# Patient Record
Sex: Female | Born: 1935 | Race: White | Hispanic: No | Smoking: Former smoker
Health system: Southern US, Community
[De-identification: ages and names within clinical notes are randomized; demographics above are authoritative.]

## PROBLEM LIST (undated history)

## (undated) DIAGNOSIS — G459 Transient cerebral ischemic attack, unspecified: Secondary | ICD-10-CM

## (undated) DIAGNOSIS — R011 Cardiac murmur, unspecified: Secondary | ICD-10-CM

## (undated) DIAGNOSIS — E079 Disorder of thyroid, unspecified: Secondary | ICD-10-CM

## (undated) DIAGNOSIS — N200 Calculus of kidney: Secondary | ICD-10-CM

## (undated) DIAGNOSIS — F419 Anxiety disorder, unspecified: Secondary | ICD-10-CM

## (undated) DIAGNOSIS — G20A1 Parkinson's disease without dyskinesia, without mention of fluctuations: Secondary | ICD-10-CM

## (undated) DIAGNOSIS — E785 Hyperlipidemia, unspecified: Secondary | ICD-10-CM

## (undated) DIAGNOSIS — E119 Type 2 diabetes mellitus without complications: Secondary | ICD-10-CM

## (undated) DIAGNOSIS — F0390 Unspecified dementia without behavioral disturbance: Secondary | ICD-10-CM

## (undated) DIAGNOSIS — I10 Essential (primary) hypertension: Secondary | ICD-10-CM

## (undated) DIAGNOSIS — N289 Disorder of kidney and ureter, unspecified: Secondary | ICD-10-CM

## (undated) DIAGNOSIS — M1990 Unspecified osteoarthritis, unspecified site: Secondary | ICD-10-CM

## (undated) DIAGNOSIS — K219 Gastro-esophageal reflux disease without esophagitis: Secondary | ICD-10-CM

## (undated) DIAGNOSIS — F329 Major depressive disorder, single episode, unspecified: Secondary | ICD-10-CM

## (undated) DIAGNOSIS — I639 Cerebral infarction, unspecified: Secondary | ICD-10-CM

## (undated) DIAGNOSIS — G20 Parkinson's disease: Secondary | ICD-10-CM

## (undated) DIAGNOSIS — F32A Depression, unspecified: Secondary | ICD-10-CM

## (undated) HISTORY — DX: Unspecified osteoarthritis, unspecified site: M19.90

## (undated) HISTORY — DX: Transient cerebral ischemic attack, unspecified: G45.9

## (undated) HISTORY — DX: Calculus of kidney: N20.0

## (undated) HISTORY — PX: CHOLECYSTECTOMY: SHX55

## (undated) HISTORY — PX: CAROTID ENDARTERECTOMY: SUR193

## (undated) HISTORY — PX: TONSILLECTOMY AND ADENOIDECTOMY: SUR1326

## (undated) HISTORY — DX: Cardiac murmur, unspecified: R01.1

---

## 2004-09-27 ENCOUNTER — Inpatient Hospital Stay: Payer: Self-pay | Admitting: Internal Medicine

## 2005-04-26 ENCOUNTER — Ambulatory Visit: Payer: Self-pay | Admitting: Internal Medicine

## 2005-07-04 ENCOUNTER — Ambulatory Visit: Payer: Self-pay | Admitting: Pain Medicine

## 2005-07-12 ENCOUNTER — Ambulatory Visit: Payer: Self-pay | Admitting: Pain Medicine

## 2005-07-16 ENCOUNTER — Ambulatory Visit: Payer: Self-pay | Admitting: Pain Medicine

## 2005-07-24 ENCOUNTER — Other Ambulatory Visit: Payer: Self-pay

## 2005-07-24 ENCOUNTER — Inpatient Hospital Stay: Payer: Self-pay | Admitting: Internal Medicine

## 2005-12-10 ENCOUNTER — Ambulatory Visit: Payer: Self-pay | Admitting: *Deleted

## 2006-02-20 ENCOUNTER — Ambulatory Visit (HOSPITAL_COMMUNITY): Admission: RE | Admit: 2006-02-20 | Discharge: 2006-02-21 | Payer: Self-pay | Admitting: *Deleted

## 2006-05-06 ENCOUNTER — Inpatient Hospital Stay: Payer: Self-pay | Admitting: Internal Medicine

## 2006-08-06 ENCOUNTER — Ambulatory Visit: Payer: Self-pay | Admitting: Internal Medicine

## 2006-10-09 ENCOUNTER — Ambulatory Visit: Payer: Self-pay | Admitting: Unknown Physician Specialty

## 2006-12-18 ENCOUNTER — Encounter: Admission: RE | Admit: 2006-12-18 | Discharge: 2006-12-18 | Payer: Self-pay | Admitting: Internal Medicine

## 2008-01-01 ENCOUNTER — Ambulatory Visit: Payer: Self-pay | Admitting: Internal Medicine

## 2008-01-09 ENCOUNTER — Other Ambulatory Visit: Payer: Self-pay

## 2008-01-09 ENCOUNTER — Emergency Department: Payer: Self-pay | Admitting: Emergency Medicine

## 2008-07-11 ENCOUNTER — Ambulatory Visit: Payer: Self-pay | Admitting: Internal Medicine

## 2008-08-02 ENCOUNTER — Ambulatory Visit: Payer: Self-pay | Admitting: Internal Medicine

## 2008-08-10 ENCOUNTER — Ambulatory Visit: Payer: Self-pay | Admitting: Internal Medicine

## 2008-10-11 ENCOUNTER — Ambulatory Visit: Payer: Self-pay | Admitting: Internal Medicine

## 2008-11-03 ENCOUNTER — Ambulatory Visit: Payer: Self-pay | Admitting: Internal Medicine

## 2008-11-08 ENCOUNTER — Ambulatory Visit: Payer: Self-pay | Admitting: Internal Medicine

## 2009-01-08 ENCOUNTER — Ambulatory Visit: Payer: Self-pay | Admitting: Internal Medicine

## 2009-02-02 ENCOUNTER — Ambulatory Visit: Payer: Self-pay | Admitting: Internal Medicine

## 2009-02-08 ENCOUNTER — Ambulatory Visit: Payer: Self-pay | Admitting: Internal Medicine

## 2009-05-19 ENCOUNTER — Ambulatory Visit: Payer: Self-pay | Admitting: Internal Medicine

## 2009-06-10 ENCOUNTER — Ambulatory Visit: Payer: Self-pay | Admitting: Internal Medicine

## 2009-06-17 ENCOUNTER — Ambulatory Visit: Payer: Self-pay | Admitting: Internal Medicine

## 2009-08-18 ENCOUNTER — Ambulatory Visit: Payer: Self-pay | Admitting: Internal Medicine

## 2009-09-10 ENCOUNTER — Ambulatory Visit: Payer: Self-pay | Admitting: Internal Medicine

## 2009-11-16 ENCOUNTER — Ambulatory Visit: Payer: Self-pay | Admitting: Internal Medicine

## 2009-12-09 ENCOUNTER — Ambulatory Visit: Payer: Self-pay | Admitting: Internal Medicine

## 2010-02-08 ENCOUNTER — Ambulatory Visit: Payer: Self-pay | Admitting: Internal Medicine

## 2010-02-15 ENCOUNTER — Ambulatory Visit: Payer: Self-pay | Admitting: Internal Medicine

## 2010-02-20 ENCOUNTER — Ambulatory Visit: Payer: Self-pay | Admitting: Unknown Physician Specialty

## 2010-03-10 ENCOUNTER — Ambulatory Visit: Payer: Self-pay | Admitting: Internal Medicine

## 2010-06-19 ENCOUNTER — Ambulatory Visit: Payer: Self-pay | Admitting: Internal Medicine

## 2010-07-11 ENCOUNTER — Ambulatory Visit: Payer: Self-pay | Admitting: Internal Medicine

## 2010-10-20 ENCOUNTER — Ambulatory Visit: Payer: Self-pay | Admitting: Internal Medicine

## 2010-11-09 ENCOUNTER — Ambulatory Visit: Payer: Self-pay | Admitting: Internal Medicine

## 2011-01-27 ENCOUNTER — Ambulatory Visit: Payer: Self-pay | Admitting: Internal Medicine

## 2011-02-07 ENCOUNTER — Other Ambulatory Visit: Payer: Self-pay | Admitting: Unknown Physician Specialty

## 2011-02-14 ENCOUNTER — Ambulatory Visit: Payer: Self-pay | Admitting: Internal Medicine

## 2011-03-19 ENCOUNTER — Ambulatory Visit: Payer: Self-pay | Admitting: Internal Medicine

## 2011-04-11 ENCOUNTER — Ambulatory Visit: Payer: Self-pay | Admitting: Internal Medicine

## 2012-01-01 ENCOUNTER — Emergency Department: Payer: Self-pay | Admitting: Emergency Medicine

## 2012-02-19 ENCOUNTER — Ambulatory Visit: Payer: Self-pay | Admitting: Internal Medicine

## 2012-07-08 LAB — CBC
HCT: 34.7 % — ABNORMAL LOW (ref 35.0–47.0)
MCH: 28.7 pg (ref 26.0–34.0)
MCHC: 33.1 g/dL (ref 32.0–36.0)
RBC: 4 10*6/uL (ref 3.80–5.20)
RDW: 13.4 % (ref 11.5–14.5)
WBC: 12.5 10*3/uL — ABNORMAL HIGH (ref 3.6–11.0)

## 2012-07-08 LAB — COMPREHENSIVE METABOLIC PANEL
Albumin: 3.5 g/dL (ref 3.4–5.0)
Anion Gap: 8 (ref 7–16)
Calcium, Total: 8.6 mg/dL (ref 8.5–10.1)
Chloride: 112 mmol/L — ABNORMAL HIGH (ref 98–107)
EGFR (African American): 50 — ABNORMAL LOW
EGFR (Non-African Amer.): 43 — ABNORMAL LOW
Glucose: 112 mg/dL — ABNORMAL HIGH (ref 65–99)
Potassium: 4.6 mmol/L (ref 3.5–5.1)
Sodium: 142 mmol/L (ref 136–145)

## 2012-07-08 LAB — TROPONIN I: Troponin-I: 0.05 ng/mL

## 2012-07-08 LAB — URINALYSIS, COMPLETE
Glucose,UR: NEGATIVE mg/dL (ref 0–75)
Nitrite: POSITIVE
Ph: 5 (ref 4.5–8.0)
Protein: 30

## 2012-07-09 ENCOUNTER — Inpatient Hospital Stay: Payer: Self-pay | Admitting: Internal Medicine

## 2012-07-09 LAB — COMPREHENSIVE METABOLIC PANEL
Bilirubin,Total: 0.3 mg/dL (ref 0.2–1.0)
Chloride: 115 mmol/L — ABNORMAL HIGH (ref 98–107)
Co2: 13 mmol/L — ABNORMAL LOW (ref 21–32)
EGFR (African American): >60
EGFR (Non-African Amer.): 52 — ABNORMAL LOW
Glucose: 76 mg/dL (ref 65–99)
Osmolality: 283 (ref 275–301)
Potassium: 4.6 mmol/L (ref 3.5–5.1)
SGPT (ALT): 16 U/L (ref 12–78)
Total Protein: 6 g/dL — ABNORMAL LOW (ref 6.4–8.2)

## 2012-07-09 LAB — CBC WITH DIFFERENTIAL/PLATELET
Basophil %: 0.4 %
Eosinophil #: 0.1 10*3/uL (ref 0.0–0.7)
Eosinophil %: 0.4 %
Lymphocyte %: 11.5 %
MCH: 29.5 pg (ref 26.0–34.0)
MCV: 88 fL (ref 80–100)
Neutrophil %: 78.3 %
Platelet: 244 10*3/uL (ref 150–440)
RBC: 3.6 10*6/uL — ABNORMAL LOW (ref 3.80–5.20)

## 2012-07-10 LAB — BASIC METABOLIC PANEL
Anion Gap: 10 (ref 7–16)
BUN: 15 mg/dL (ref 7–18)
Chloride: 110 mmol/L — ABNORMAL HIGH (ref 98–107)
Creatinine: 1.08 mg/dL (ref 0.60–1.30)
Potassium: 4.7 mmol/L (ref 3.5–5.1)
Sodium: 140 mmol/L (ref 136–145)

## 2012-07-10 LAB — CBC WITH DIFFERENTIAL/PLATELET
Basophil %: 0.3 %
Eosinophil %: 0.4 %
Lymphocyte #: 1.2 10*3/uL (ref 1.0–3.6)
MCH: 29.2 pg (ref 26.0–34.0)
MCV: 85 fL (ref 80–100)
Monocyte #: 1.2 x10 3/mm — ABNORMAL HIGH (ref 0.2–0.9)
Neutrophil #: 9 10*3/uL — ABNORMAL HIGH (ref 1.4–6.5)
RBC: 3.44 10*6/uL — ABNORMAL LOW (ref 3.80–5.20)

## 2012-07-11 LAB — CBC WITH DIFFERENTIAL/PLATELET
Basophil %: 0.2 %
Eosinophil #: 0.3 10*3/uL (ref 0.0–0.7)
Lymphocyte #: 1.6 10*3/uL (ref 1.0–3.6)
MCV: 85 fL (ref 80–100)
Monocyte #: 1.2 x10 3/mm — ABNORMAL HIGH (ref 0.2–0.9)
Neutrophil #: 7.2 10*3/uL — ABNORMAL HIGH (ref 1.4–6.5)
Neutrophil %: 70.4 %
Platelet: 210 10*3/uL (ref 150–440)
RBC: 3.28 10*6/uL — ABNORMAL LOW (ref 3.80–5.20)
RDW: 12.7 % (ref 11.5–14.5)
WBC: 10.2 10*3/uL (ref 3.6–11.0)

## 2012-07-11 LAB — COMPREHENSIVE METABOLIC PANEL
Albumin: 2.5 g/dL — ABNORMAL LOW (ref 3.4–5.0)
Anion Gap: 11 (ref 7–16)
Calcium, Total: 8 mg/dL — ABNORMAL LOW (ref 8.5–10.1)
Co2: 20 mmol/L — ABNORMAL LOW (ref 21–32)
EGFR (African American): 49 — ABNORMAL LOW
Potassium: 3.6 mmol/L (ref 3.5–5.1)
SGOT(AST): 39 U/L — ABNORMAL HIGH (ref 15–37)
Sodium: 141 mmol/L (ref 136–145)

## 2012-07-12 LAB — BASIC METABOLIC PANEL
Anion Gap: 10 (ref 7–16)
BUN: 13 mg/dL (ref 7–18)
Chloride: 113 mmol/L — ABNORMAL HIGH (ref 98–107)
Creatinine: 1.13 mg/dL (ref 0.60–1.30)
EGFR (Non-African Amer.): 45 — ABNORMAL LOW
Glucose: 117 mg/dL — ABNORMAL HIGH (ref 65–99)
Osmolality: 286 (ref 275–301)
Potassium: 4.1 mmol/L (ref 3.5–5.1)

## 2012-07-12 LAB — CBC WITH DIFFERENTIAL/PLATELET
Basophil #: 0 10*3/uL (ref 0.0–0.1)
Eosinophil #: 0.4 10*3/uL (ref 0.0–0.7)
Lymphocyte %: 10 %
MCHC: 32.8 g/dL (ref 32.0–36.0)
Neutrophil %: 75.8 %
RBC: 3.49 10*6/uL — ABNORMAL LOW (ref 3.80–5.20)
RDW: 13 % (ref 11.5–14.5)
WBC: 9.8 10*3/uL (ref 3.6–11.0)

## 2012-07-14 LAB — BASIC METABOLIC PANEL
BUN: 17 mg/dL (ref 7–18)
Chloride: 108 mmol/L — ABNORMAL HIGH (ref 98–107)
Creatinine: 1.14 mg/dL (ref 0.60–1.30)
Osmolality: 287 (ref 275–301)
Potassium: 3.8 mmol/L (ref 3.5–5.1)

## 2012-07-14 LAB — CULTURE, BLOOD (SINGLE)

## 2014-04-13 ENCOUNTER — Ambulatory Visit: Payer: Self-pay | Admitting: Internal Medicine

## 2014-08-26 ENCOUNTER — Ambulatory Visit: Payer: Self-pay | Admitting: Internal Medicine

## 2014-12-28 NOTE — H&P (Signed)
PATIENT NAME:  Cheryl Wilson, Cheryl Wilson MR#:  [id number] DATE OF BIRTH:  12-09-1927  DATE OF ADMISSION:  07/08/2012  REFERRING PHYSICIAN: Dr. Ladona Ridgelaylor in the Emergency Room   FAMILY PHYSICIAN: Aram BeechamJeffrey Edlin Ford, MD     REASON FOR ADMISSION: Intractable nausea and vomiting with urinary tract infection and altered mental status.   HISTORY OF PRESENT ILLNESS: The patient is an 81 year old female with a history of depression, pseudodementia, chronic ataxia, and peripheral neuropathy. She presents to the Emergency Room with confusion and fever where she was found to have a urinary tract infection. She had intractable nausea and vomiting with dry heaves in the Emergency Room. She is now admitted for further evaluation.   PAST MEDICAL HISTORY:  1. Chronic ataxia.  2. Depression with pseudodementia.  3. Peripheral neuropathy.  4. History of transient ischemic attacks.  5. Chronic insomnia.  6. Osteoarthritis.  7. Venostasis with peripheral edema.  8. Benign hypertension.  9. Type 2 diabetes.  10. Hyperlipidemia.  11. Irritable bowel syndrome.  12. Peripheral vascular disease, status post left carotid endarterectomy.  13. History of upper GI bleed.   MEDICATIONS:  1. Mysoline 50 mg p.o. daily.  2. Combivent 1 puff q.i.d.  3. Lomotil 2.5 mg p.o. every 6 hours p.Wilson.n. diarrhea.  4. Plavix 75 mg p.o. daily.  5. Zoloft 75 mg p.o. at bedtime.  6. Xanax 0.25 mg p.o. every 6 hours as needed.  7. Dyazide 1 p.o. daily.  8. Diovan 160 mg p.o. daily.  9. Detrol LA 4 mg p.o. daily.  10. Crestor 10 mg p.o. at bedtime.  11. Aricept 5 mg p.o. at bedtime.  12. AcipHex 20 mg p.o. b.i.d.   ALLERGIES: Sulfa, penicillin, amoxicillin, erythromycin, also morphine.   SOCIAL HISTORY: Negative for alcohol or tobacco abuse.   FAMILY HISTORY: Positive for coronary artery disease, otherwise unremarkable.   REVIEW OF SYSTEMS: Unable to obtain due to the patient's confusion.   PHYSICAL EXAMINATION:  GENERAL: The  patient is in moderate distress with dry heaves.   VITAL SIGNS: Vital signs are remarkable for a blood pressure of 200/79 with a heart rate of 68 and a respiratory rate of 18. Temperature is 99.5.   HEENT: Normocephalic, atraumatic. Pupils are equally round and reactive to light and accommodation. Extraocular movements are intact. Sclerae are anicteric. Conjunctivae are clear.  Oropharynx is dry but clear.   NECK: Supple without jugular venous distention. No adenopathy or thyromegaly was noted.   LUNGS: Clear to auscultation and percussion without wheezes, rales, or rhonchi. No dullness.   CARDIAC: Regular rate and rhythm with normal S1 and S2. No significant rubs, murmurs, or gallops. PMI is nondisplaced. Chest wall is nontender.   ABDOMEN: Soft, nontender with normoactive bowel sounds. No organomegaly or masses were appreciated. No hernias or bruits were noted.   EXTREMITIES: Without clubbing, cyanosis, or edema. Pulses were 2+ bilaterally.   SKIN: Warm and dry without rash or lesions.   NEUROLOGIC: Cranial nerves II through XII are grossly intact. Deep tendon reflexes were symmetric. Motor and sensory examination was nonfocal.   PSYCHIATRIC: Exam revealed a patient who was somewhat lethargic and confused and was unable to answer questions appropriately.   LABORATORY, DIAGNOSTIC AND RADIOLOGICAL DATA: Glucose was 112 with a BUN of 17, creatinine 1.17, with a GFR of 43. White count was 12.5 with a hemoglobin of 11.5. Urinalysis showed 284 WBCs per high-power field with 2+ bacteria and 3+ leukocyte esterase. Head CT was unremarkable.   ASSESSMENT:  1. Urinary  tract infection.  2. Altered mental status due to encephalopathy.  3. Dehydration.  4. Hyperglycemia.  5. Intractable nausea and vomiting.  6. Anemia of chronic disease.   PLAN:  1. The patient will be admitted to the floor under observation status.  2. She will be started on IV fluids with IV antibiotics with Zofran as needed  for nausea. We will continue her routine outpatient medications.  3. We will follow her sugars with Accu-Cheks before meals and at bedtime and add sliding scale insulin as needed.  4. We will consult the Case Manager as well as Physical Therapy for discharge planning.  5. We will send off blood and urine cultures and adjust antibiotics accordingly. Follow up routine labs in the morning.  6. Further treatment and evaluation will depend upon the patient's progress.   TOTAL TIME SPENT: 45 minutes.  ____________________________ Duane Lope Judithann Sheen, MD jds:cbb D: 07/08/2012 18:06:30 ET T: 07/08/2012 18:41:22 ET JOB#: [id number] cc: Duane Lope. Judithann Sheen, MD, <Dictator> Corbett Moulder Rodena Medin MD ELECTRONICALLY SIGNED 07/09/2012 7:56

## 2014-12-28 NOTE — Discharge Summary (Signed)
PATIENT NAME:  Cheryl Wilson, [name] MR#:  [id number] DATE OF BIRTH:  07/19/1928  DATE OF ADMISSION:  07/09/2012 DATE OF DISCHARGE:  07/14/2012  REASON FOR ADMISSION: Intractable nausea, vomiting with altered mental status and urinary tract infection.   HISTORY OF PRESENT ILLNESS: Please see the dictated history and physical done by myself on 07/09/2012.   PAST MEDICAL HISTORY:  1. Chronic ataxia.  2. Depression with pseudodementia.  3. Peripheral neuropathy.  4. History of transient ischemic attacks.  5. Chronic insomnia.   6. Osteoarthritis.  7. Venostasis with peripheral edema.  8. Benign hypertension.  9. Type 2 diabetes.  10. Hyperlipidemia.  11. Irritable bowel syndrome. 12. Peripheral vascular disease status post left carotid endarterectomy.  13. History of upper gastrointestinal bleed.   MEDICATIONS ON ADMISSION: Please see admission note.   ALLERGIES: Sulfa, penicillin, amoxicillin, erythromycin, and morphine.   SOCIAL HISTORY, FAMILY HISTORY, AND REVIEW OF SYSTEMS: As per history of present illness.    PHYSICAL EXAMINATION: GENERAL: Patient was in moderate distress with dry heaves. VITAL SIGNS: Vital signs were remarkable for initial blood pressure of 200/79 with a temperature of 99.5. HEENT exam was unremarkable, except for dry oropharynx. Neck was supple without JVD. Lungs are clear. Cardiac examination revealed a regular rate and rhythm with normal S1 and S2. Abdomen soft and nontender. Extremities without edema. Neurologic exam was grossly nonfocal.   HOSPITAL COURSE: The patient was admitted with intractable nausea, vomiting with urinary tract infection and altered mental status due to encephalopathy. She was placed on the floor as an inpatient. She was started on IV fluids with IV antibiotics with Zofran as needed for nausea. The patient improved with conservative therapy. Her urine culture was positive for Klebsiella. Blood cultures remained negative. The patient's mental  status improved. Her sugars remained stable. Her nausea, vomiting resolved. She was seen by physical therapy and was ambulating with a walker. Her IV antibiotics were switched to p.o. antibiotics with continued improvement. By 07/14/2012, the patient was stable and ready for discharge.   DISCHARGE DIAGNOSES:  1. Klebsiella urinary tract infection.  2. Altered mental status due to encephalopathy.  3. Type 2 diabetes.  4. Dehydration. 5. Chronic ataxia.  6. Peripheral vascular disease.  7. Tremor.   DISCHARGE MEDICATIONS:  1. Mysoline 50 mg p.o. at bedtime.  2. Reglan 10 mg p.o. at bedtime.  3. Welchol 2 tablets p.o. b.i.d.  4. Plavix 75 mg p.o. daily.  5. Xanax 0.25 mg p.o. daily as needed for nervousness.  6. Aricept 5 mg p.o. daily.  7. Crestor 10 mg p.o. daily.  8. Detrol LA 4 mg p.o. daily.  9. Diovan 160 mg p.o. daily.  10. Iron supplement 1 p.o. b.i.d.  11. Lomotil 2.5 mg p.o. q.6 hours p.r.n. diarrhea.  12. Prevacid 15 mg p.o. daily.  13. Zoloft 75 mg p.o. daily.  14. Neurontin 100 mg p.o. b.i.d.  15. Mobic 7.5 mg p.o. daily.  16. Tramadol 50 mg p.o. q.6 hours as needed for pain.  17. Zofran 4 mg p.o. q.4 hours p.r.n. nausea, vomiting.  18. Cipro 500 mg p.o. b.i.d. for one week.   FOLLOW-UP PLANS AND APPOINTMENTS: The patient was discharged on an 1800 calorie ADA diet with home health. She will follow up with me in 1 to 2 weeks, sooner if needed.   ____________________________ Duane LopeJeffrey D. Judithann SheenSparks, MD jds:cms D: 07/17/2012 16:18:31 ET T: 07/18/2012 11:28:02 ET JOB#: [id number]  cc: Duane LopeJeffrey D. Judithann SheenSparks, MD, <Dictator> Seaborn Nakama Rodena Medin Sharniece Gibbon MD ELECTRONICALLY SIGNED  07/18/2012 15:33 

## 2015-07-25 ENCOUNTER — Emergency Department: Payer: Medicare Other

## 2015-07-25 ENCOUNTER — Encounter: Payer: Self-pay | Admitting: Emergency Medicine

## 2015-07-25 ENCOUNTER — Emergency Department
Admission: EM | Admit: 2015-07-25 | Discharge: 2015-07-25 | Disposition: A | Discharge status: Home / Self Care | Payer: Medicare Other | Attending: Emergency Medicine | Admitting: Emergency Medicine

## 2015-07-25 DIAGNOSIS — I10 Essential (primary) hypertension: Secondary | ICD-10-CM | POA: Insufficient documentation

## 2015-07-25 DIAGNOSIS — E119 Type 2 diabetes mellitus without complications: Secondary | ICD-10-CM | POA: Insufficient documentation

## 2015-07-25 DIAGNOSIS — R079 Chest pain, unspecified: Secondary | ICD-10-CM | POA: Insufficient documentation

## 2015-07-25 HISTORY — DX: Anxiety disorder, unspecified: F41.9

## 2015-07-25 HISTORY — DX: Depression, unspecified: F32.A

## 2015-07-25 HISTORY — DX: Hyperlipidemia, unspecified: E78.5

## 2015-07-25 HISTORY — DX: Unspecified dementia, unspecified severity, without behavioral disturbance, psychotic disturbance, mood disturbance, and anxiety: F03.90

## 2015-07-25 HISTORY — DX: Parkinson's disease: G20

## 2015-07-25 HISTORY — DX: Cerebral infarction, unspecified: I63.9

## 2015-07-25 HISTORY — DX: Parkinson's disease without dyskinesia, without mention of fluctuations: G20.A1

## 2015-07-25 HISTORY — DX: Gastro-esophageal reflux disease without esophagitis: K21.9

## 2015-07-25 HISTORY — DX: Type 2 diabetes mellitus without complications: E11.9

## 2015-07-25 HISTORY — DX: Major depressive disorder, single episode, unspecified: F32.9

## 2015-07-25 HISTORY — DX: Essential (primary) hypertension: I10

## 2015-07-25 HISTORY — DX: Disorder of kidney and ureter, unspecified: N28.9

## 2015-07-25 HISTORY — DX: Disorder of thyroid, unspecified: E07.9

## 2015-07-25 LAB — BASIC METABOLIC PANEL
Anion gap: 5 (ref 5–15)
BUN: 22 mg/dL — AB (ref 6–20)
CALCIUM: 8.5 mg/dL — AB (ref 8.9–10.3)
CO2: 27 mmol/L (ref 22–32)
CREATININE: 1.4 mg/dL — AB (ref 0.44–1.00)
Chloride: 106 mmol/L (ref 101–111)
GFR calc Af Amer: 39 mL/min — ABNORMAL LOW (ref >60)
GFR, EST NON AFRICAN AMERICAN: 33 mL/min — AB (ref >60)
GLUCOSE: 119 mg/dL — AB (ref 65–99)
Potassium: 4.4 mmol/L (ref 3.5–5.1)
Sodium: 138 mmol/L (ref 135–145)

## 2015-07-25 LAB — CBC
HCT: 37 % (ref 35.0–47.0)
Hemoglobin: 12.2 g/dL (ref 12.0–16.0)
MCH: 28.7 pg (ref 26.0–34.0)
MCHC: 33.1 g/dL (ref 32.0–36.0)
MCV: 86.8 fL (ref 80.0–100.0)
PLATELETS: 227 10*3/uL (ref 150–440)
RBC: 4.26 MIL/uL (ref 3.80–5.20)
RDW: 13.1 % (ref 11.5–14.5)
WBC: 7.6 10*3/uL (ref 3.6–11.0)

## 2015-07-25 LAB — TROPONIN I
Troponin I: <0.03 ng/mL (ref <0.031)
Troponin I: <0.03 ng/mL (ref <0.031)

## 2015-07-25 NOTE — ED Provider Notes (Signed)
[facility] Emergency Department Provider Note    ____________________________________________  Time seen: 1430  I have reviewed the triage vital signs and the nursing notes.   HISTORY  Chief Complaint Chest Pain   History limited by: Not Limited   HPI Cheryl Wilson is a 81 y.o. female who presents to the emergency department today because of concerns for chest pain. The patient states that this happened roughly 2-1/2 hours ago. She describes it as 2 very brief episodes of left-sided chest pain. They came on suddenly and went suddenly. She states it lasted maybe a second. She did not appreciate any shortness of breath with the pain. She did not have any sweating during the pain. She denies any recent nausea or vomiting. Denies any recent fevers.   Past Medical History  Diagnosis Date  . Dementia   . Diabetes mellitus without complication (HCC)   . Hyperlipemia   . Depression   . GERD (gastroesophageal reflux disease)   . Renal disorder   . CVA (cerebral infarction)   . Anxiety   . Parkinson disease (HCC)   . Thyroid disease   . Hypertension     There are no active problems to display for this patient.   Past Surgical History  Procedure Laterality Date  . Carotid endarterectomy    . Cholecystectomy      No current outpatient prescriptions on file.  Allergies Review of patient's allergies indicates no known allergies.  History reviewed. No pertinent family history.  Social History Social History  Substance Use Topics  . Smoking status: Never Smoker   . Smokeless tobacco: None  . Alcohol Use: No    Review of Systems  Constitutional: Negative for fever. Cardiovascular: Positive for chest pain. Respiratory: Negative for shortness of breath. Gastrointestinal: Negative for abdominal pain, vomiting and diarrhea. Genitourinary: Negative for dysuria. Musculoskeletal: Negative for back pain. Skin: Negative for  rash. Neurological: Negative for headaches, focal weakness or numbness.  10-point ROS otherwise negative.  ____________________________________________   PHYSICAL EXAM:  VITAL SIGNS: ED Triage Vitals  Enc Vitals Group     BP 07/25/15 1338 138/39 mmHg     Pulse Rate 07/25/15 1338 70     Resp 07/25/15 1338 20     Temp 07/25/15 1338 98.3 F (36.8 C)     Temp Source 07/25/15 1338 Oral     SpO2 07/25/15 1338 99 %     Weight 07/25/15 1338 148 lb (67.132 kg)     Height 07/25/15 1338 5\' 2"  (1.575 m)     Head Cir --      Peak Flow --      Pain Score 07/25/15 1339 0   Constitutional: Alert and oriented. Well appearing and in no distress. Eyes: Conjunctivae are normal. PERRL. Normal extraocular movements. ENT   Head: Normocephalic and atraumatic.   Nose: No congestion/rhinnorhea.   Mouth/Throat: Mucous membranes are moist.   Neck: No stridor. Hematological/Lymphatic/Immunilogical: No cervical lymphadenopathy. Cardiovascular: Normal rate, regular rhythm.  No murmurs, rubs, or gallops. Respiratory: Normal respiratory effort without tachypnea nor retractions. Breath sounds are clear and equal bilaterally. No wheezes/rales/rhonchi. Gastrointestinal: Soft and nontender. No distention.  Genitourinary: Deferred Musculoskeletal: Normal range of motion in all extremities. No joint effusions.  No lower extremity tenderness nor edema. Neurologic:  Normal speech and language. No gross focal neurologic deficits are appreciated.  Skin:  Skin is warm, dry and intact. No rash noted. Psychiatric: Mood and affect are normal. Speech and behavior are normal. Patient exhibits  appropriate insight and judgment.  ____________________________________________    LABS (pertinent positives/negatives)  Labs Reviewed  BASIC METABOLIC PANEL - Abnormal; Notable for the following:    Glucose, Bld 119 (*)    BUN 22 (*)    Creatinine, Ser 1.40 (*)    Calcium 8.5 (*)    GFR calc non Af Amer 33 (*)     GFR calc Af Amer 39 (*)    All other components within normal limits  TROPONIN I  CBC     ____________________________________________   EKG  I, Phineas Semen, attending physician, personally viewed and interpreted this EKG  EKG Time: 1339 Rate: 61 Rhythm: NSR Axis: normal Intervals: qtc 372 QRS: narrow ST changes: no st elevation, t wave inversion III, aVF, V5, V6 Impression: abnormal ECG, no STEMI. T wave inversions present on EKG dated 07/08/2012   ____________________________________________    RADIOLOGY  CXR IMPRESSION: 1. No evidence for acute abnormality. 2. Question of left upper lobe pulmonary nodule. Further evaluation with chest CT is recommended.   ____________________________________________   PROCEDURES  Procedure(s) performed: None  Critical Care performed: No  ____________________________________________   INITIAL IMPRESSION / ASSESSMENT AND PLAN / ED COURSE  Pertinent labs & imaging results that were available during my care of the patient were reviewed by me and considered in my medical decision making (see chart for details).  Patient presented to the emergency department today because of concerns for chest pain. The clinical story was not a great story for ACS given the sharp area brief nature. However given the patient's age I did check 2 troponins. These were both negative. In addition the EKG did not show any concerning changes or ST elevation. I did discuss with the patient and family and the finding of the pulmonary nodule on the chest x-ray and strongly encourage follow-up with a primary care doctor.  ____________________________________________   FINAL CLINICAL IMPRESSION(S) / ED DIAGNOSES  Final diagnoses:  Chest pain, unspecified chest pain type     Phineas Semen, MD 07/25/15 2221

## 2015-07-25 NOTE — Discharge Instructions (Signed)
As discussed please speak with Dr. Judithann SheenSparks to get a CT scan performed of your chest to better evaluate the nodule seen on chest x-ray today. Please seek medical attention for any high fevers, chest pain, shortness of breath, change in behavior, persistent vomiting, bloody stool or any other new or concerning symptoms.   Nonspecific Chest Pain  Chest pain can be caused by many different conditions. There is always a chance that your pain could be related to something serious, such as a heart attack or a blood clot in your lungs. Chest pain can also be caused by conditions that are not life-threatening. If you have chest pain, it is very important to follow up with your health care provider. CAUSES  Chest pain can be caused by:  Heartburn.  Pneumonia or bronchitis.  Anxiety or stress.  Inflammation around your heart (pericarditis) or lung (pleuritis or pleurisy).  A blood clot in your lung.  A collapsed lung (pneumothorax). It can develop suddenly on its own (spontaneous pneumothorax) or from trauma to the chest.  Shingles infection (varicella-zoster virus).  Heart attack.  Damage to the bones, muscles, and cartilage that make up your chest wall. This can include:  Bruised bones due to injury.  Strained muscles or cartilage due to frequent or repeated coughing or overwork.  Fracture to one or more ribs.  Sore cartilage due to inflammation (costochondritis). RISK FACTORS  Risk factors for chest pain may include:  Activities that increase your risk for trauma or injury to your chest.  Respiratory infections or conditions that cause frequent coughing.  Medical conditions or overeating that can cause heartburn.  Heart disease or family history of heart disease.  Conditions or health behaviors that increase your risk of developing a blood clot.  Having had chicken pox (varicella zoster). SIGNS AND SYMPTOMS Chest pain can feel like:  Burning or tingling on the surface of your  chest or deep in your chest.  Crushing, pressure, aching, or squeezing pain.  Dull or sharp pain that is worse when you move, cough, or take a deep breath.  Pain that is also felt in your back, neck, shoulder, or arm, or pain that spreads to any of these areas. Your chest pain may come and go, or it may stay constant. DIAGNOSIS Lab tests or other studies may be needed to find the cause of your pain. Your health care provider may have you take a test called an ambulatory ECG (electrocardiogram). An ECG records your heartbeat patterns at the time the test is performed. You may also have other tests, such as:  Transthoracic echocardiogram (TTE). During echocardiography, sound waves are used to create a picture of all of the heart structures and to look at how blood flows through your heart.  Transesophageal echocardiogram (TEE).This is a more advanced imaging test that obtains images from inside your body. It allows your health care provider to see your heart in finer detail.  Cardiac monitoring. This allows your health care provider to monitor your heart rate and rhythm in real time.  Holter monitor. This is a portable device that records your heartbeat and can help to diagnose abnormal heartbeats. It allows your health care provider to track your heart activity for several days, if needed.  Stress tests. These can be done through exercise or by taking medicine that makes your heart beat more quickly.  Blood tests.  Imaging tests. TREATMENT  Your treatment depends on what is causing your chest pain. Treatment may include:  Medicines. These  may include:  Acid blockers for heartburn.  Anti-inflammatory medicine.  Pain medicine for inflammatory conditions.  Antibiotic medicine, if an infection is present.  Medicines to dissolve blood clots.  Medicines to treat coronary artery disease.  Supportive care for conditions that do not require medicines. This may  include:  Resting.  Applying heat or cold packs to injured areas.  Limiting activities until pain decreases. HOME CARE INSTRUCTIONS  If you were prescribed an antibiotic medicine, finish it all even if you start to feel better.  Avoid any activities that bring on chest pain.  Do not use any tobacco products, including cigarettes, chewing tobacco, or electronic cigarettes. If you need help quitting, ask your health care provider.  Do not drink alcohol.  Take medicines only as directed by your health care provider.  Keep all follow-up visits as directed by your health care provider. This is important. This includes any further testing if your chest pain does not go away.  If heartburn is the cause for your chest pain, you may be told to keep your head raised (elevated) while sleeping. This reduces the chance that acid will go from your stomach into your esophagus.  Make lifestyle changes as directed by your health care provider. These may include:  Getting regular exercise. Ask your health care provider to suggest some activities that are safe for you.  Eating a heart-healthy diet. A registered dietitian can help you to learn healthy eating options.  Maintaining a healthy weight.  Managing diabetes, if necessary.  Reducing stress. SEEK MEDICAL CARE IF:  Your chest pain does not go away after treatment.  You have a rash with blisters on your chest.  You have a fever. SEEK IMMEDIATE MEDICAL CARE IF:   Your chest pain is worse.  You have an increasing cough, or you cough up blood.  You have severe abdominal pain.  You have severe weakness.  You faint.  You have chills.  You have sudden, unexplained chest discomfort.  You have sudden, unexplained discomfort in your arms, back, neck, or jaw.  You have shortness of breath at any time.  You suddenly start to sweat, or your skin gets clammy.  You feel nauseous or you vomit.  You suddenly feel light-headed or  dizzy.  Your heart begins to beat quickly, or it feels like it is skipping beats. These symptoms may represent a serious problem that is an emergency. Do not wait to see if the symptoms will go away. Get medical help right away. Call your local emergency services (911 in the [place] Do not drive yourself to the hospital.   This information is not intended to replace advice given to you by your health care provider. Make sure you discuss any questions you have with your health care provider.   Document Released: 06/06/2005 Document Revised: 09/17/2014 Document Reviewed: 04/02/2014 Elsevier Interactive Patient Education [employer].

## 2015-07-25 NOTE — ED Notes (Signed)
Pt to ed with c/o 2 sharp chest pains this am, denies sob.  States the episodes lasted 1 second and then they were over.  Denies radiation of pain, denies dizziness, denies weakness, denies n/v.

## 2015-07-25 NOTE — ED Notes (Signed)
AAOx3.  Skin warm and dry.  NAD.  Denies c/o pain./

## 2015-08-01 ENCOUNTER — Other Ambulatory Visit: Payer: Self-pay | Admitting: Internal Medicine

## 2015-08-01 DIAGNOSIS — R911 Solitary pulmonary nodule: Secondary | ICD-10-CM

## 2015-08-16 ENCOUNTER — Ambulatory Visit: Payer: Medicare Other

## 2015-08-22 ENCOUNTER — Ambulatory Visit
Admission: RE | Admit: 2015-08-22 | Discharge: 2015-08-22 | Disposition: A | Discharge status: Home / Self Care | Payer: Medicare Other | Source: Ambulatory Visit | Attending: Internal Medicine | Admitting: Internal Medicine

## 2015-08-22 DIAGNOSIS — R918 Other nonspecific abnormal finding of lung field: Secondary | ICD-10-CM | POA: Diagnosis not present

## 2015-08-22 DIAGNOSIS — R911 Solitary pulmonary nodule: Secondary | ICD-10-CM | POA: Diagnosis not present

## 2015-08-23 ENCOUNTER — Other Ambulatory Visit: Payer: Self-pay | Admitting: Internal Medicine

## 2015-08-23 DIAGNOSIS — R918 Other nonspecific abnormal finding of lung field: Secondary | ICD-10-CM

## 2015-09-12 ENCOUNTER — Emergency Department: Payer: Medicare Other

## 2015-09-12 ENCOUNTER — Observation Stay
Admission: EM | Admit: 2015-09-12 | Discharge: 2015-09-15 | Disposition: A | Discharge status: Skilled Nursing Facility | Payer: Medicare Other | Attending: Internal Medicine | Admitting: Internal Medicine

## 2015-09-12 ENCOUNTER — Encounter: Payer: Self-pay | Admitting: Emergency Medicine

## 2015-09-12 DIAGNOSIS — Z880 Allergy status to penicillin: Secondary | ICD-10-CM | POA: Insufficient documentation

## 2015-09-12 DIAGNOSIS — N390 Urinary tract infection, site not specified: Secondary | ICD-10-CM | POA: Diagnosis present

## 2015-09-12 DIAGNOSIS — Z79899 Other long term (current) drug therapy: Secondary | ICD-10-CM | POA: Diagnosis not present

## 2015-09-12 DIAGNOSIS — B961 Klebsiella pneumoniae [K. pneumoniae] as the cause of diseases classified elsewhere: Secondary | ICD-10-CM | POA: Insufficient documentation

## 2015-09-12 DIAGNOSIS — G20 Parkinson's disease: Secondary | ICD-10-CM | POA: Insufficient documentation

## 2015-09-12 DIAGNOSIS — R531 Weakness: Secondary | ICD-10-CM | POA: Diagnosis present

## 2015-09-12 DIAGNOSIS — F419 Anxiety disorder, unspecified: Secondary | ICD-10-CM | POA: Insufficient documentation

## 2015-09-12 DIAGNOSIS — F329 Major depressive disorder, single episode, unspecified: Secondary | ICD-10-CM | POA: Insufficient documentation

## 2015-09-12 DIAGNOSIS — F0280 Dementia in other diseases classified elsewhere without behavioral disturbance: Secondary | ICD-10-CM | POA: Diagnosis not present

## 2015-09-12 DIAGNOSIS — Z7901 Long term (current) use of anticoagulants: Secondary | ICD-10-CM | POA: Diagnosis not present

## 2015-09-12 DIAGNOSIS — Z791 Long term (current) use of non-steroidal anti-inflammatories (NSAID): Secondary | ICD-10-CM | POA: Diagnosis not present

## 2015-09-12 DIAGNOSIS — E079 Disorder of thyroid, unspecified: Secondary | ICD-10-CM | POA: Insufficient documentation

## 2015-09-12 DIAGNOSIS — N3000 Acute cystitis without hematuria: Principal | ICD-10-CM | POA: Insufficient documentation

## 2015-09-12 DIAGNOSIS — Z8673 Personal history of transient ischemic attack (TIA), and cerebral infarction without residual deficits: Secondary | ICD-10-CM | POA: Insufficient documentation

## 2015-09-12 DIAGNOSIS — K219 Gastro-esophageal reflux disease without esophagitis: Secondary | ICD-10-CM | POA: Insufficient documentation

## 2015-09-12 DIAGNOSIS — E119 Type 2 diabetes mellitus without complications: Secondary | ICD-10-CM | POA: Diagnosis not present

## 2015-09-12 DIAGNOSIS — E785 Hyperlipidemia, unspecified: Secondary | ICD-10-CM | POA: Diagnosis not present

## 2015-09-12 DIAGNOSIS — I10 Essential (primary) hypertension: Secondary | ICD-10-CM | POA: Diagnosis not present

## 2015-09-12 LAB — BASIC METABOLIC PANEL
ANION GAP: 5 (ref 5–15)
BUN: 21 mg/dL — AB (ref 6–20)
CHLORIDE: 101 mmol/L (ref 101–111)
CO2: 26 mmol/L (ref 22–32)
Calcium: 8 mg/dL — ABNORMAL LOW (ref 8.9–10.3)
Creatinine, Ser: 1.3 mg/dL — ABNORMAL HIGH (ref 0.44–1.00)
GFR calc Af Amer: 42 mL/min — ABNORMAL LOW (ref >60)
GFR, EST NON AFRICAN AMERICAN: 36 mL/min — AB (ref >60)
GLUCOSE: 110 mg/dL — AB (ref 65–99)
POTASSIUM: 4 mmol/L (ref 3.5–5.1)
SODIUM: 132 mmol/L — AB (ref 135–145)

## 2015-09-12 LAB — URINALYSIS COMPLETE WITH MICROSCOPIC (ARMC ONLY)
BILIRUBIN URINE: NEGATIVE
Glucose, UA: NEGATIVE mg/dL
KETONES UR: NEGATIVE mg/dL
Nitrite: NEGATIVE
PH: 6 (ref 5.0–8.0)
PROTEIN: NEGATIVE mg/dL
SPECIFIC GRAVITY, URINE: 1.006 (ref 1.005–1.030)

## 2015-09-12 LAB — CBC WITH DIFFERENTIAL/PLATELET
BASOS ABS: 0 10*3/uL (ref 0–0.1)
Basophils Relative: 0 %
Eosinophils Absolute: 0 10*3/uL (ref 0–0.7)
Eosinophils Relative: 1 %
HCT: 30.1 % — ABNORMAL LOW (ref 35.0–47.0)
HEMOGLOBIN: 10.1 g/dL — AB (ref 12.0–16.0)
LYMPHS ABS: 1.3 10*3/uL (ref 1.0–3.6)
LYMPHS PCT: 12 %
MCH: 29 pg (ref 26.0–34.0)
MCHC: 33.6 g/dL (ref 32.0–36.0)
MCV: 86.3 fL (ref 80.0–100.0)
Monocytes Absolute: 1.3 10*3/uL — ABNORMAL HIGH (ref 0.2–0.9)
Monocytes Relative: 12 %
NEUTROS PCT: 75 %
Neutro Abs: 7.7 10*3/uL — ABNORMAL HIGH (ref 1.4–6.5)
Platelets: 212 10*3/uL (ref 150–440)
RBC: 3.49 MIL/uL — AB (ref 3.80–5.20)
RDW: 13.3 % (ref 11.5–14.5)
WBC: 10.3 10*3/uL (ref 3.6–11.0)

## 2015-09-12 LAB — TROPONIN I: Troponin I: <0.03 ng/mL (ref <0.031)

## 2015-09-12 MED ORDER — CEPHALEXIN 500 MG PO CAPS: 500.0000 mg | ORAL_CAPSULE | Freq: Three times a day (TID) | ORAL | Status: DC

## 2015-09-12 MED ORDER — DEXTROSE 5 % IV SOLN
1.0000 g | INTRAVENOUS | Status: AC
  Administered 2015-09-13: 1 g via INTRAVENOUS
  Filled 2015-09-12: qty 10

## 2015-09-12 NOTE — ED Provider Notes (Signed)
-----------------------------------------   10:15 PM on 09/12/2015 -----------------------------------------   Blood pressure 169/86, pulse 68, temperature 99.4 F (37.4 C), temperature source Oral, resp. rate 20, height 5\' 6"  (1.676 m), weight 73.8 kg, SpO2 100 %.  Assuming care from Dr. Derrill KayGoodman.  In short, Cheryl Wilson is a 82 y.o. female with a chief complaint of Weakness .  Refer to the original H&P for additional details.  The current plan of care is to follow-up the urinalysis.  If the urine is grossly infected I will treat with IV antibiotics and discharge with the family and outpatient medication.  If the urinalysis is clean, I will admit for concern of TIA.  ----------------------------------------- 11:22 PM on 09/12/2015 -----------------------------------------  The patient is awake and communicative and in no acute distress.  Her urinalysis is grossly infected.  I am giving him ceftriaxone 1 g IV.  I discussed again with her family and as long as she is ambulatory they are comfortable taking her home.  However, she needs to be able to ambulate, which we will test after getting her antibiotics.  ----------------------------------------- 12:15 AM on 09/13/2015 -----------------------------------------  Patient still waiting on antibiotics which will be administered shortly.  Staff to test ambulation.  Signed out to Dr. Manson PasseyBrown to dispo appropriately based on ambulation (admit if cannot walk given concern for TIA vs complicated UTI w/ weakness).   Cheryl Roseory Yesly Gerety, MD 09/13/15 [phone]

## 2015-09-12 NOTE — ED Provider Notes (Signed)
[facility] Emergency Department Provider Note    ____________________________________________  Time seen: 60  I have reviewed the triage vital signs and the nursing notes.   HISTORY  Chief Complaint Weakness   History limited by: Not Limited, some history obtained from family.   HPI Cheryl Wilson is a 82 y.o. female with history of dementia, diabetes, CVA who presents to the emergency department today after an episode of confusion and difficulty with walking. Family states that they noticed this upon visiting the patient in the morning. The patient herself first noticed it when she tried to get out of bed. She states she felt well last night prior to going to bed. The patient and family both state that the symptoms have improved. They state that in addition to the difficulty with walking and confusion and they felt like the patient was having a hard time producing the right words. They state it sounded like she was just seen one syllable word. The patient herself was aware of the symptoms that were happening to her. She denies any recent fevers, chest pain or shortness of breath.     Past Medical History  Diagnosis Date  . Dementia   . Diabetes mellitus without complication (HCC)   . Hyperlipemia   . Depression   . GERD (gastroesophageal reflux disease)   . Renal disorder   . CVA (cerebral infarction)   . Anxiety   . Parkinson disease (HCC)   . Thyroid disease   . Hypertension     There are no active problems to display for this patient.   Past Surgical History  Procedure Laterality Date  . Carotid endarterectomy    . Cholecystectomy      No current outpatient prescriptions on file.  Allergies Review of patient's allergies indicates no known allergies.  No family history on file.  Social History Social History  Substance Use Topics  . Smoking status: Never Smoker   . Smokeless tobacco: None  . Alcohol Use: No     Review of Systems  Constitutional: Negative for fever. Cardiovascular: Negative for chest pain. Respiratory: Negative for shortness of breath. Gastrointestinal: Negative for abdominal pain, vomiting and diarrhea. Genitourinary: Negative for dysuria. Neurological: Positive for slurred speech, difficulty with walking.   10-point ROS otherwise negative.  ____________________________________________   PHYSICAL EXAM:  VITAL SIGNS: ED Triage Vitals  Enc Vitals Group     BP 09/12/15 1751 123/54 mmHg     Pulse Rate 09/12/15 1751 74     Resp 09/12/15 1751 12     Temp 09/12/15 1751 99.4 F (37.4 C)     Temp Source 09/12/15 1751 Oral     SpO2 09/12/15 1751 99 %     Weight 09/12/15 1751 162 lb 11.2 oz (73.8 kg)     Height 09/12/15 1751 5\' 6"  (1.676 m)     Head Cir --      Peak Flow --      Pain Score 09/12/15 1753 0   Constitutional: Alert and oriented. Well appearing and in no distress. Eyes: Conjunctivae are normal. PERRL. Normal extraocular movements. ENT   Head: Normocephalic and atraumatic.   Nose: No congestion/rhinnorhea.   Mouth/Throat: Mucous membranes are moist.   Neck: No stridor. Hematological/Lymphatic/Immunilogical: No cervical lymphadenopathy. Cardiovascular: Normal rate, regular rhythm.  No murmurs, rubs, or gallops. Respiratory: Normal respiratory effort without tachypnea nor retractions. Breath sounds are clear and equal bilaterally. No wheezes/rales/rhonchi. Gastrointestinal: Soft and nontender. No distention.  Genitourinary: Deferred Musculoskeletal: Normal  range of motion in all extremities. No joint effusions.  No lower extremity tenderness nor edema. Neurologic:  Normal speech and language. No gross focal neurologic deficits are appreciated.  Skin:  Skin is warm, dry and intact. No rash noted. Psychiatric: Mood and affect are normal. Speech and behavior are normal. Patient exhibits appropriate insight and  judgment.  ____________________________________________    LABS (pertinent positives/negatives)  Na 132 Trop <0.03 WBC 10.3  ____________________________________________   EKG  I, Phineas SemenGraydon Storey Stangeland, attending physician, personally viewed and interpreted this EKG  EKG Time: 1752 Rate: 70 Rhythm: unclear, possible sinus Axis: normal Intervals: qtc 389 QRS: narrow ST changes: st depression V4-v6 Impression: abnormal ekg ____________________________________________    RADIOLOGY  Ct head IMPRESSION: Advanced atrophy and chronic small vessel ischemic change without CT findings of acute intracranial abnormality. The appearance is unchanged from prior exam.   ____________________________________________   PROCEDURES  Procedure(s) performed: None  Critical Care performed: No  ____________________________________________   INITIAL IMPRESSION / ASSESSMENT AND PLAN / ED COURSE  Pertinent labs & imaging results that were available during my care of the patient were reviewed by me and considered in my medical decision making (see chart for details).  Patient presented to the emergency department today after an episode of confusion. The time of my examination family stated the patient was referred back to her baseline. Head CT was negative. Blood work without any obvious etiology for the confusion. UA was pending at the time of sign out. At this point I think likely patient either suffering from urinary tract infection or had a TIA.  ____________________________________________   FINAL CLINICAL IMPRESSION(S) / ED DIAGNOSES  Confusion  Phineas SemenGraydon Quinesha Selinger, MD 09/13/15 1423

## 2015-09-12 NOTE — ED Notes (Signed)
Patient brought in [facility] from home, called out by family for reports of difficulty walking and slurred speech. Last known well time unknown. Family reports that patient was also c/o severe headache

## 2015-09-12 NOTE — Discharge Instructions (Signed)
You have been seen in the Emergency Department (ED) today for weakness and confusion.  Your workup today suggests that the cause is likely a urinary tract infection (UTI).  Please take your antibiotic as prescribed and over-the-counter pain medication (Tylenol or Motrin) as needed, but no more than recommended on the label instructions.  Drink PLENTY of fluids.  Call your regular doctor to schedule the next available appointment to follow up on todays ED visit, or return immediately to the ED if your pain worsens, you have decreased urine production, develop fever, persistent vomiting, or other symptoms that concern you.   Urinary Tract Infection Urinary tract infections (UTIs) can develop anywhere along your urinary tract. Your urinary tract is your body's drainage system for removing wastes and extra water. Your urinary tract includes two kidneys, two ureters, a bladder, and a urethra. Your kidneys are a pair of bean-shaped organs. Each kidney is about the size of your fist. They are located below your ribs, one on each side of your spine. CAUSES Infections are caused by microbes, which are microscopic organisms, including fungi, viruses, and bacteria. These organisms are so small that they can only be seen through a microscope. Bacteria are the microbes that most commonly cause UTIs. SYMPTOMS  Symptoms of UTIs may vary by age and gender of the patient and by the location of the infection. Symptoms in young women typically include a frequent and intense urge to urinate and a painful, burning feeling in the bladder or urethra during urination. Older women and men are more likely to be tired, shaky, and weak and have muscle aches and abdominal pain. A fever may mean the infection is in your kidneys. Other symptoms of a kidney infection include pain in your back or sides below the ribs, nausea, and vomiting. DIAGNOSIS To diagnose a UTI, your caregiver will ask you about your symptoms. Your caregiver  will also ask you to provide a urine sample. The urine sample will be tested for bacteria and white blood cells. White blood cells are made by your body to help fight infection. TREATMENT  Typically, UTIs can be treated with medication. Because most UTIs are caused by a bacterial infection, they usually can be treated with the use of antibiotics. The choice of antibiotic and length of treatment depend on your symptoms and the type of bacteria causing your infection. HOME CARE INSTRUCTIONS  If you were prescribed antibiotics, take them exactly as your caregiver instructs you. Finish the medication even if you feel better after you have only taken some of the medication.  Drink enough water and fluids to keep your urine clear or pale yellow.  Avoid caffeine, tea, and carbonated beverages. They tend to irritate your bladder.  Empty your bladder often. Avoid holding urine for long periods of time.  Empty your bladder before and after sexual intercourse.  After a bowel movement, women should cleanse from front to back. Use each tissue only once. SEEK MEDICAL CARE IF:   You have back pain.  You develop a fever.  Your symptoms do not begin to resolve within 3 days. SEEK IMMEDIATE MEDICAL CARE IF:   You have severe back pain or lower abdominal pain.  You develop chills.  You have nausea or vomiting.  You have continued burning or discomfort with urination. MAKE SURE YOU:   Understand these instructions.  Will watch your condition.  Will get help right away if you are not doing well or get worse.   This information is not  intended to replace advice given to you by your health care provider. Make sure you discuss any questions you have with your health care provider.   Document Released: 06/06/2005 Document Revised: 05/18/2015 Document Reviewed: 10/05/2011 Elsevier Interactive Patient Education [employer].

## 2015-09-12 NOTE — ED Notes (Signed)
MD at bedside. 

## 2015-09-13 ENCOUNTER — Encounter: Payer: Self-pay | Admitting: Internal Medicine

## 2015-09-13 DIAGNOSIS — N390 Urinary tract infection, site not specified: Secondary | ICD-10-CM | POA: Diagnosis present

## 2015-09-13 LAB — CBC
HCT: 29.4 % — ABNORMAL LOW (ref 35.0–47.0)
Hemoglobin: 9.9 g/dL — ABNORMAL LOW (ref 12.0–16.0)
MCH: 28.7 pg (ref 26.0–34.0)
MCHC: 33.8 g/dL (ref 32.0–36.0)
MCV: 84.9 fL (ref 80.0–100.0)
PLATELETS: 198 10*3/uL (ref 150–440)
RBC: 3.46 MIL/uL — AB (ref 3.80–5.20)
RDW: 12.9 % (ref 11.5–14.5)
WBC: 9.1 10*3/uL (ref 3.6–11.0)

## 2015-09-13 LAB — BASIC METABOLIC PANEL
Anion gap: 7 (ref 5–15)
BUN: 18 mg/dL (ref 6–20)
CHLORIDE: 104 mmol/L (ref 101–111)
CO2: 25 mmol/L (ref 22–32)
CREATININE: 1.1 mg/dL — AB (ref 0.44–1.00)
Calcium: 8.3 mg/dL — ABNORMAL LOW (ref 8.9–10.3)
GFR calc non Af Amer: 44 mL/min — ABNORMAL LOW (ref >60)
GFR, EST AFRICAN AMERICAN: 52 mL/min — AB (ref >60)
GLUCOSE: 100 mg/dL — AB (ref 65–99)
Potassium: 3.9 mmol/L (ref 3.5–5.1)
Sodium: 136 mmol/L (ref 135–145)

## 2015-09-13 LAB — GLUCOSE, CAPILLARY
GLUCOSE-CAPILLARY: 125 mg/dL — AB (ref 65–99)
Glucose-Capillary: 86 mg/dL (ref 65–99)
Glucose-Capillary: 94 mg/dL (ref 65–99)
Glucose-Capillary: 148 mg/dL — ABNORMAL HIGH (ref 65–99)
Glucose-Capillary: 95 mg/dL (ref 65–99)

## 2015-09-13 LAB — MAGNESIUM: Magnesium: 1.2 mg/dL — ABNORMAL LOW (ref 1.7–2.4)

## 2015-09-13 MED ORDER — CARBIDOPA-LEVODOPA 25-100 MG PO TABS
1.0000 | ORAL_TABLET | Freq: Three times a day (TID) | ORAL | Status: DC
  Administered 2015-09-13 – 2015-09-15 (×8): 1 via ORAL
  Filled 2015-09-13 (×8): qty 1

## 2015-09-13 MED ORDER — FLEET ENEMA 7-19 GM/118ML RE ENEM: 1.0000 | ENEMA | Freq: Once | RECTAL | Status: DC | PRN

## 2015-09-13 MED ORDER — COLESTIPOL HCL 1 G PO TABS
2.0000 g | ORAL_TABLET | Freq: Two times a day (BID) | ORAL | Status: DC
  Administered 2015-09-13 – 2015-09-15 (×5): 2 g via ORAL
  Filled 2015-09-13 (×5): qty 2

## 2015-09-13 MED ORDER — ONDANSETRON HCL 4 MG/2ML IJ SOLN
4.0000 mg | Freq: Four times a day (QID) | INTRAMUSCULAR | Status: DC | PRN
  Administered 2015-09-13: 4 mg via INTRAVENOUS
  Filled 2015-09-13: qty 2

## 2015-09-13 MED ORDER — LEVOTHYROXINE SODIUM 50 MCG PO TABS
25.0000 ug | ORAL_TABLET | Freq: Every day | ORAL | Status: DC
  Administered 2015-09-13 – 2015-09-15 (×3): 25 ug via ORAL
  Filled 2015-09-13 (×3): qty 1

## 2015-09-13 MED ORDER — SODIUM CHLORIDE 0.9 % IV BOLUS (SEPSIS)
500.0000 mL | Freq: Once | INTRAVENOUS | Status: AC
  Administered 2015-09-13: 500 mL via INTRAVENOUS

## 2015-09-13 MED ORDER — PRIMIDONE 50 MG PO TABS
50.0000 mg | ORAL_TABLET | Freq: Two times a day (BID) | ORAL | Status: DC
  Administered 2015-09-13 – 2015-09-15 (×5): 50 mg via ORAL
  Filled 2015-09-13 (×7): qty 1

## 2015-09-13 MED ORDER — POLYETHYLENE GLYCOL 3350 17 G PO PACK: 17.0000 g | PACK | Freq: Every day | ORAL | Status: DC | PRN

## 2015-09-13 MED ORDER — FOLTANX RF 3-90.314-2-35 MG PO CAPS: 1.0000 | ORAL_CAPSULE | ORAL | Status: DC

## 2015-09-13 MED ORDER — PANTOPRAZOLE SODIUM 40 MG PO TBEC
40.0000 mg | DELAYED_RELEASE_TABLET | Freq: Every day | ORAL | Status: DC
  Administered 2015-09-13 – 2015-09-15 (×3): 40 mg via ORAL
  Filled 2015-09-13 (×3): qty 1

## 2015-09-13 MED ORDER — ROSUVASTATIN CALCIUM 10 MG PO TABS
10.0000 mg | ORAL_TABLET | Freq: Every day | ORAL | Status: DC
  Administered 2015-09-13 – 2015-09-14 (×2): 10 mg via ORAL
  Filled 2015-09-13 (×2): qty 1

## 2015-09-13 MED ORDER — INSULIN ASPART 100 UNIT/ML ~~LOC~~ SOLN
0.0000 [IU] | Freq: Three times a day (TID) | SUBCUTANEOUS | Status: DC
Start: 2015-09-13 — End: 2015-09-15

## 2015-09-13 MED ORDER — ALBUTEROL SULFATE (2.5 MG/3ML) 0.083% IN NEBU: 2.5000 mg | INHALATION_SOLUTION | RESPIRATORY_TRACT | Status: DC | PRN

## 2015-09-13 MED ORDER — POLYSACCHARIDE IRON COMPLEX 150 MG PO CAPS
150.0000 mg | ORAL_CAPSULE | Freq: Two times a day (BID) | ORAL | Status: DC
  Administered 2015-09-13 – 2015-09-15 (×5): 150 mg via ORAL
  Filled 2015-09-13 (×6): qty 1

## 2015-09-13 MED ORDER — INSULIN ASPART 100 UNIT/ML ~~LOC~~ SOLN: 0.0000 [IU] | Freq: Every day | SUBCUTANEOUS | Status: DC

## 2015-09-13 MED ORDER — DEXTROSE 5 % IV SOLN: 1.0000 g | INTRAVENOUS | Status: DC

## 2015-09-13 MED ORDER — CLOPIDOGREL BISULFATE 75 MG PO TABS
75.0000 mg | ORAL_TABLET | ORAL | Status: DC
  Administered 2015-09-13 – 2015-09-15 (×3): 75 mg via ORAL
  Filled 2015-09-13 (×4): qty 1

## 2015-09-13 MED ORDER — ENOXAPARIN SODIUM 40 MG/0.4ML ~~LOC~~ SOLN
40.0000 mg | SUBCUTANEOUS | Status: DC
  Administered 2015-09-14: 40 mg via SUBCUTANEOUS
  Filled 2015-09-13 (×3): qty 0.4

## 2015-09-13 MED ORDER — MAGNESIUM SULFATE 2 GM/50ML IV SOLN
2.0000 g | Freq: Once | INTRAVENOUS | Status: AC
  Administered 2015-09-13: 2 g via INTRAVENOUS
  Filled 2015-09-13: qty 50

## 2015-09-13 MED ORDER — ENOXAPARIN SODIUM 40 MG/0.4ML ~~LOC~~ SOLN
40.0000 mg | Freq: Every day | SUBCUTANEOUS | Status: DC
  Filled 2015-09-13: qty 0.4

## 2015-09-13 MED ORDER — ASPIRIN EC 81 MG PO TBEC
81.0000 mg | DELAYED_RELEASE_TABLET | Freq: Every day | ORAL | Status: DC
  Administered 2015-09-13 – 2015-09-15 (×3): 81 mg via ORAL
  Filled 2015-09-13 (×3): qty 1

## 2015-09-13 MED ORDER — BUSPIRONE HCL 5 MG PO TABS
5.0000 mg | ORAL_TABLET | Freq: Two times a day (BID) | ORAL | Status: DC
  Administered 2015-09-13 – 2015-09-15 (×5): 5 mg via ORAL
  Filled 2015-09-13 (×5): qty 1

## 2015-09-13 MED ORDER — SODIUM CHLORIDE 0.9 % IV SOLN
INTRAVENOUS | Status: DC
  Administered 2015-09-13 – 2015-09-15 (×4): via INTRAVENOUS

## 2015-09-13 MED ORDER — ONDANSETRON HCL 4 MG PO TABS
4.0000 mg | ORAL_TABLET | Freq: Four times a day (QID) | ORAL | Status: DC | PRN
  Administered 2015-09-13: 4 mg via ORAL
  Filled 2015-09-13: qty 1

## 2015-09-13 MED ORDER — ALPRAZOLAM 0.25 MG PO TABS
0.2500 mg | ORAL_TABLET | Freq: Every evening | ORAL | Status: DC | PRN
  Administered 2015-09-14 – 2015-09-15 (×3): 0.25 mg via ORAL
  Filled 2015-09-13 (×3): qty 1

## 2015-09-13 MED ORDER — METOCLOPRAMIDE HCL 5 MG PO TABS
5.0000 mg | ORAL_TABLET | Freq: Four times a day (QID) | ORAL | Status: DC
  Administered 2015-09-13 – 2015-09-15 (×8): 5 mg via ORAL
  Filled 2015-09-13 (×8): qty 1

## 2015-09-13 MED ORDER — ACETAMINOPHEN 325 MG PO TABS
650.0000 mg | ORAL_TABLET | Freq: Four times a day (QID) | ORAL | Status: DC | PRN
  Administered 2015-09-13 (×3): 650 mg via ORAL
  Filled 2015-09-13 (×3): qty 2

## 2015-09-13 MED ORDER — DOCUSATE SODIUM 100 MG PO CAPS
100.0000 mg | ORAL_CAPSULE | Freq: Two times a day (BID) | ORAL | Status: DC
  Administered 2015-09-13 – 2015-09-15 (×5): 100 mg via ORAL
  Filled 2015-09-13 (×5): qty 1

## 2015-09-13 MED ORDER — SELEGILINE HCL 5 MG PO TABS
5.0000 mg | ORAL_TABLET | Freq: Two times a day (BID) | ORAL | Status: DC
  Administered 2015-09-13 – 2015-09-15 (×5): 5 mg via ORAL
  Filled 2015-09-13 (×7): qty 1

## 2015-09-13 MED ORDER — DEXTROSE 5 % IV SOLN
1.0000 g | INTRAVENOUS | Status: DC
  Administered 2015-09-13: 1 g via INTRAVENOUS
  Filled 2015-09-13 (×3): qty 10

## 2015-09-13 MED ORDER — MIRABEGRON ER 25 MG PO TB24
25.0000 mg | ORAL_TABLET | ORAL | Status: DC
  Administered 2015-09-13 – 2015-09-15 (×3): 25 mg via ORAL
  Filled 2015-09-13 (×5): qty 1

## 2015-09-13 MED ORDER — BISACODYL 5 MG PO TBEC: 5.0000 mg | DELAYED_RELEASE_TABLET | Freq: Every day | ORAL | Status: DC | PRN

## 2015-09-13 MED ORDER — MELOXICAM 7.5 MG PO TABS
7.5000 mg | ORAL_TABLET | Freq: Every evening | ORAL | Status: DC
  Administered 2015-09-13 – 2015-09-14 (×2): 7.5 mg via ORAL
  Filled 2015-09-13 (×4): qty 1

## 2015-09-13 MED ORDER — ACETAMINOPHEN 650 MG RE SUPP: 650.0000 mg | Freq: Four times a day (QID) | RECTAL | Status: DC | PRN

## 2015-09-13 MED ORDER — DONEPEZIL HCL 5 MG PO TABS
5.0000 mg | ORAL_TABLET | Freq: Every day | ORAL | Status: DC
  Administered 2015-09-13 – 2015-09-14 (×2): 5 mg via ORAL
  Filled 2015-09-13 (×2): qty 1

## 2015-09-13 MED ORDER — ESCITALOPRAM OXALATE 10 MG PO TABS
10.0000 mg | ORAL_TABLET | ORAL | Status: DC
  Administered 2015-09-13 – 2015-09-15 (×3): 10 mg via ORAL
  Filled 2015-09-13 (×4): qty 1

## 2015-09-13 MED ORDER — DEXTROSE 5 % IV SOLN
1.0000 g | INTRAVENOUS | Status: DC
  Filled 2015-09-13: qty 10

## 2015-09-13 MED ORDER — GABAPENTIN 100 MG PO CAPS
100.0000 mg | ORAL_CAPSULE | Freq: Three times a day (TID) | ORAL | Status: DC
  Administered 2015-09-13 – 2015-09-15 (×8): 100 mg via ORAL
  Filled 2015-09-13 (×8): qty 1

## 2015-09-13 NOTE — Progress Notes (Signed)
PHARMACIST - PHYSICIAN ORDER COMMUNICATION  CONCERNING: P&T Medication Policy on Herbal Medications  DESCRIPTION:  This patient's order for:  Foltanx RF  has been noted.  This product(s) is classified as an "herbal" or natural product. Due to a lack of definitive safety studies or FDA approval, nonstandard manufacturing practices, plus the potential risk of unknown drug-drug interactions while on inpatient medications, the Pharmacy and Therapeutics Committee does not permit the use of "herbal" or natural products of this type within [facility].   ACTION TAKEN: The pharmacy department is unable to verify this order at this time. Please reevaluate patient's clinical condition at discharge and address if the herbal or natural product(s) should be resumed at that time.

## 2015-09-13 NOTE — Clinical Social Work Note (Addendum)
Clinical Social Work Assessment  Patient Details  Name: Cheryl Wilson MRN: [id number] Date of Birth: 11-08-1927  Date of referral:  09/13/15               Reason for consult:  Discharge Planning                Permission sought to share information with:  Case Manager, Family Supports Permission granted to share information::  Yes, Verbal Permission Granted  Name::        Agency::     Relationship::   Cheryl Wilson (Daughter) [phone] & Cheryl Wilson (Son) [phone])  Contact Information:     Housing/Transportation Living arrangements for the past 2 months:  Single Family Home Source of Information:  Patient, Adult Children Patient Interpreter Needed:  None Criminal Activity/Legal Involvement Pertinent to Current Situation/Hospitalization:  No - Comment as needed Significant Relationships:  Adult Children, [place] Lives with:  Self Do you feel safe going back to the place where you live?  Yes Need for family participation in patient care:  Yes (Comment) Cheryl Wilson (Daughter) [phone]- & Cheryl Wilson (Son) [phone])  Care giving concerns:  Patient's daughter reports that patient isn't able to get around "good". Per patient's daughter patient lives alone.  Social Worker assessment / plan:  CSW met with patient, her daughter Cheryl Wilson [phone]) and son Cheryl Wilson [phone]). It is reported that patient lives alone. CSW informed patient and her family PT recommendations for SNF. Patient's daughter requested that CSW does not use the term "nursing home" around her mother. She reports her mother would not agree if that term is used. Patient and her family are agreeable to SNF placement at discharge. CSW informed patient and her family that there could be a co-pay for SNF placement staring day one depending on [facility] plan. CSW provided patient and her family the [place] List. CSW informed patient and her family the facilities located in  [place]. Patient's daughter asked for a specific recommendation from [place] informed patient's daughter that she could not recommend a specific facility. CSW encouraged patient's family to to review ratings of each facility on [employer]. Patient and her family voiced concerns about a co-pay. They reported that patient's income is limited. Per patient's son they could "try to pull our resources together" to assist with paying patient's co-pay. CSW encouraged patient's family to contact [facility] and review patient's rehab benefit. Patient's family reports that they aren't able to provide facility preference at this time.   FL2 complete and faxed to SNFs in [facility]. PASRR received. CSW will continue to follow and assist.    Employment status:  Retired Forensic scientist:  Medicare PT Recommendations:  [place] / Referral to community resources:  [place]  Patient/Family's Response to care:  Patient and her family is agreeable to SNF placement at discharge.   Patient/Family's Understanding of and Emotional Response to Diagnosis, Current Treatment, and Prognosis:  Patient was pleasant. Patient's family were concerned about [facility] co-pay. CSW provided support. Pateint's family were pleasant. They were grateful for CSW's assistance.   Emotional Assessment Appearance:  Appears stated age Attitude/Demeanor/Rapport:   (None) Affect (typically observed):  Accepting, Calm, Pleasant Orientation:  Oriented to Self, Oriented to Place, Oriented to  Time, Oriented to Situation Alcohol / Substance use:  Never Used Psych involvement (Current and /or in the community):  No (Comment) (Patient has a diagnosis of Anxiety and Depression . )  Discharge Needs  Concerns to be addressed:  Discharge Planning Concerns, Financial / Insurance Concerns Readmission within the last 30 days:  No Current discharge risk:  Chronically ill Barriers to  Discharge:  Continued Medical Work up   [employer], LCSW 09/13/2015, 4:00 PM

## 2015-09-13 NOTE — Progress Notes (Signed)
[facility] [place] at [facility]   PATIENT NAME: Cheryl Wilson    MR#:  [id number]  DATE OF BIRTH:  1928-06-02  SUBJECTIVE:  CHIEF COMPLAINT:   Chief Complaint  Patient presents with  . Weakness   -Complaining of significant weakness in both lower extremities and had a fall yesterday prior to admission. -Mental status more alert and clear according to daughter. -Urine cultures growing gram-negative rods  REVIEW OF SYSTEMS:  Review of Systems  Constitutional: Negative for fever and chills.  Respiratory: Negative for cough, shortness of breath and wheezing.   Cardiovascular: Negative for chest pain and palpitations.  Gastrointestinal: Negative for nausea, vomiting, abdominal pain, diarrhea and constipation.  Genitourinary: Positive for dysuria and frequency.  Neurological: Positive for weakness. Negative for dizziness, speech change, focal weakness, seizures and headaches.  Psychiatric/Behavioral: Negative for depression.    DRUG ALLERGIES:   Allergies  Allergen Reactions  . Penicillins Other (See Comments)    Reaction: Unknown  Has patient had a PCN reaction causing immediate rash, facial/tongue/throat swelling, SOB or lightheadedness with hypotension: NO Has patient had a PCN reaction causing severe rash involving mucus membranes or skin necrosis: NO Has patient had a PCN reaction that required hospitalization: NO Has patient had a PCN reaction occurring within the last 10 years: NO If all of the above answers are "NO", then may proceed with Cephalosporin use.    VITALS:  Blood pressure 141/51, pulse 60, temperature 97.7 F (36.5 C), temperature source Axillary, resp. rate 18, height 5\' 6"  (1.676 m), weight 66.679 kg (147 lb), SpO2 100 %.  PHYSICAL EXAMINATION:  Physical Exam  GENERAL:  82 y.o.-year-old patient lying in the bed with no acute distress.  EYES: Pupils equal, round, reactive to light and accommodation. No scleral icterus.  Extraocular muscles intact.  HEENT: Head atraumatic, normocephalic. Oropharynx and nasopharynx clear.  NECK:  Supple, no jugular venous distention. No thyroid enlargement, no tenderness.  LUNGS: Normal breath sounds bilaterally, no wheezing, rales,rhonchi or crepitation. No use of accessory muscles of respiration.  CARDIOVASCULAR: S1, S2 normal. No rubs, or gallops. 3/6 systolic murmur is present ABDOMEN: Soft, nontender, nondistended. Bowel sounds present. No organomegaly or mass.  EXTREMITIES: No pedal edema, cyanosis, or clubbing.  NEUROLOGIC: Cranial nerves II through XII are intact. Muscle strength 5/5 in all extremities. Sensation intact. Gait not checked. Following simple commands. PSYCHIATRIC: The patient is alert and pleasantly confused SKIN: No obvious rash, lesion, or ulcer.    LABORATORY PANEL:   CBC  Recent Labs Lab 09/13/15 0558  WBC 9.1  HGB 9.9*  HCT 29.4*  PLT 198   ------------------------------------------------------------------------------------------------------------------  Chemistries   Recent Labs Lab 09/13/15 0558  NA 136  K 3.9  CL 104  CO2 25  GLUCOSE 100*  BUN 18  CREATININE 1.10*  CALCIUM 8.3*  MG 1.2*   ------------------------------------------------------------------------------------------------------------------  Cardiac Enzymes  Recent Labs Lab 09/12/15 1748  TROPONINI <0.03   ------------------------------------------------------------------------------------------------------------------  RADIOLOGY:  Ct Head Wo Contrast  09/12/2015  CLINICAL DATA:  Slurred speech.  Difficulty walking.  Headache. EXAM: CT HEAD WITHOUT CONTRAST TECHNIQUE: Contiguous axial images were obtained from the base of the skull through the vertex without intravenous contrast. COMPARISON:  Brain MRI 08/26/2014, head CT 07/08/2012 FINDINGS: Diffuse cerebral atrophy, stable in degree from prior exam. Rather extensive white matter disease, also stable and  likely sequela of prior chronic small vessel ischemia. No intracranial hemorrhage, evidence of acute ischemia, or cerebral edema. No subdural or intracranial fluid collection. Atherosclerosis  noted of the skullbase vasculature. Chronic opacification of lower left mastoid air cells. Paranasal sinuses are clear. No acute calvarial abnormality. IMPRESSION: Advanced atrophy and chronic small vessel ischemic change without CT findings of acute intracranial abnormality. The appearance is unchanged from prior exam. Electronically Signed   By: Rubye OaksMelanie  Ehinger M.D.   On: 09/12/2015 19:51    EKG:   Orders placed or performed during the hospital encounter of 09/12/15  . EKG 12-Lead  . EKG 12-Lead    ASSESSMENT AND PLAN:   82 year old elderly female with past medical history significant for Parkinson's disease, dementia, hypertension and diabetes mellitus presented with severe weakness and inability to stand up. Noted to have UTI.  #1 acute cystitis-urine cultures are pending. -Continue Rocephin for now. -Gentle hydration as needed. -Monitor fevers and white count.  #2 falls-secondary to weakness from UTI likely. -Physical therapy consulted.  #3 hypomagnesemia-magnesium being replaced.  #4 Parkinson's disease-continue home medications. Patient on primidone, Sinemet.  #5 dementia and depression with anxiety-continue home medications. Patient is stable at this time.  #6 DVT prophylaxis-on Lovenox   Physical therapy consult is pending.   All the records are reviewed and case discussed with Care Management/Social Workerr. Management plans discussed with the patient, family and they are in agreement.  CODE STATUS: Full code  TOTAL TIME TAKING CARE OF THIS PATIENT: 37 minutes.   POSSIBLE D/C IN 2 DAYS, DEPENDING ON CLINICAL CONDITION.   Enid BaasKALISETTI,Michale Weikel M.D on 09/13/2015 at 3:57 PM  Between 7am to 6pm - Pager - [phone]  After 6pm go to [email or web address] - password [email or web address] [facility]  [place]  [facility]  Office  [phone]  CC: Primary care physician; Marguarite ArbourSPARKS,JEFFREY D, MD

## 2015-09-13 NOTE — Clinical Social Work Placement (Signed)
   CLINICAL SOCIAL WORK PLACEMENT  NOTE  Date:  09/13/2015  Patient Details  Name: Cheryl Wilson MRN: [id number] Date of Birth: [birth year]  Clinical Social Work is seeking post-discharge placement for this patient at the Skilled  Nursing Facility level of care (*CSW will initial, date and re-position this form in  chart as items are completed):  Yes   Patient/family provided with [facility] Clinical Social Work Department's list of facilities offering this level of care within the geographic area requested by the patient (or if unable, by the patient's family).  Yes   Patient/family informed of their freedom to choose among providers that offer the needed level of care, that participate in Medicare, Medicaid or managed care program needed by the patient, have an available bed and are willing to accept the patient.  Yes   Patient/family informed of [facility]'s ownership interest in [facility] and [facility], as well as of the fact that they are under no obligation to receive care at these facilities.  PASRR submitted to EDS on 09/13/15     PASRR number received on 09/13/15     Existing PASRR number confirmed on       FL2 transmitted to all facilities in geographic area requested by pt/family on       FL2 transmitted to all facilities within larger geographic area on       Patient informed that his/her managed care company has contracts with or will negotiate with certain facilities, including the following:            Patient/family informed of bed offers received.  Patient chooses bed at       Physician recommends and patient chooses bed at      Patient to be transferred to   on  .  Patient to be transferred to facility by       Patient family notified on   of transfer.  Name of family member notified:        PHYSICIAN       Additional Comment:    _______________________________________________ Idamae Lusherhristina E Gunnard Dorrance, LCSW 09/13/2015, 3:52 PM

## 2015-09-13 NOTE — ED Notes (Signed)
Pt was placed on a bedpan to void since is difficult for the Pt to ambulate.

## 2015-09-13 NOTE — Progress Notes (Signed)
ANTIBIOTIC CONSULT NOTE - INITIAL  Pharmacy Consult for ceftriaxone Indication: UTI  Allergies  Allergen Reactions  . Penicillins Other (See Comments)    Reaction: Unknown  Has patient had a PCN reaction causing immediate rash, facial/tongue/throat swelling, SOB or lightheadedness with hypotension: NO Has patient had a PCN reaction causing severe rash involving mucus membranes or skin necrosis: NO Has patient had a PCN reaction that required hospitalization: NO Has patient had a PCN reaction occurring within the last 10 years: NO If all of the above answers are "NO", then may proceed with Cephalosporin use.   Patient Measurements: Height: 5\' 6"  (167.6 cm) Weight: 147 lb (66.679 kg) IBW/kg (Calculated) : 59.3  Vital Signs: Temp: 97.7 F (36.5 C) (01/03 0734) Temp Source: Axillary (01/03 0734) BP: 141/51 mmHg (01/03 0734) Pulse Rate: 60 (01/03 0734)  Labs:  Recent Labs  09/12/15 1748 09/13/15 0558  WBC 10.3 9.1  HGB 10.1* 9.9*  PLT 212 198  CREATININE 1.30* 1.10*   Estimated Creatinine Clearance: 35 mL/min (by C-G formula based on Cr of 1.1).   Microbiology: Recent Results (from the past 720 hour(s))  Urine culture     Status: None (Preliminary result)   Collection Time: 09/12/15 10:57 PM  Result Value Ref Range Status   Specimen Description URINE, RANDOM  Final   Special Requests Normal  Final   Culture   Final    >=100,000 COLONIES/mL GRAM NEGATIVE RODS IDENTIFICATION AND SUSCEPTIBILITIES TO FOLLOW    Report Status PENDING  Incomplete   Assessment: Pharmacy consulted to dose ceftriaxone for UTI in this 82 year old female presenting with complaints of dysuria. UA showed 2+ LE, numerous WBC, and many bacteria. Urine culture showing GNR, with identification pending.  Patient received one dose of ceftriaxone 1 g on admission this morning.   Plan:  Order ceftriaxone 1 g IV daily  Follow culture results  Pharmacy will continue to monitor, thank you for the  consult.  Jodelle RedMary M Justin Buechner 09/13/2015,12:05 PM

## 2015-09-13 NOTE — Evaluation (Signed)
Physical Therapy Evaluation Patient Details Name: Cheryl NicksLillian Rebecca Wilson MRN: [id number] DOB: 01-30-1928 Today's Date: 09/13/2015   History of Present Illness  Pt admitted for UTI with complaints of weakness and inability to walk. Pt with history of HTN, Pakinson's, dementia, depression, GERD, and anxiety. Pt with no history of falls  Clinical Impression  Pt is a pleasant 82 year old female who was admitted for UTI. Pt performs bed mobility with mod assist, transfers with min assist, and unable to ambulate at this time. Pt demonstrates deficits with strength/endurance/mobility. Would benefit from skilled PT to address above deficits and promote optimal return to PLOF.      Follow Up Recommendations SNF    Equipment Recommendations       Recommendations for Other Services       Precautions / Restrictions Precautions Precautions: Fall Restrictions Weight Bearing Restrictions: No      Mobility  Bed Mobility Overal bed mobility: Needs Assistance Bed Mobility: Supine to Sit     Supine to sit: Mod assist     General bed mobility comments: assist for bed mobility including assist for scooting out towards EOB. Once seated, pt with very kyphotic posture and able to sit with supervision. +2 assist for sit->supine  Transfers Overall transfer level: Needs assistance Equipment used: Rolling walker (2 wheeled) Transfers: Sit to/from Stand Sit to Stand: Min assist         General transfer comment: sit<>Stand with rw and safe technique. Pt with post leaning once standing and only able to stand for 10 seconds prior to needing a break  Ambulation/Gait             General Gait Details: unable to tolerate ambulation at this time  Stairs            Wheelchair Mobility    Modified Rankin (Stroke Patients Only)       Balance Overall balance assessment: Needs assistance Sitting-balance support: Bilateral upper extremity supported;Feet supported Sitting  balance-Leahy Scale: Good     Standing balance support: Bilateral upper extremity supported Standing balance-Leahy Scale: Fair                               Pertinent Vitals/Pain Pain Assessment: Faces Faces Pain Scale: Hurts even more Pain Location: B UE trying to scoot up towards HOB Pain Descriptors / Indicators: Aching Pain Intervention(s): Limited activity within patient's tolerance;Patient requesting pain meds-RN notified    Home Living Family/patient expects to be discharged to:: Private residence Living Arrangements: Alone Available Help at Discharge:  (has some family) Type of Home: House Home Access:  (TBD, poor historian)       Home Equipment: Environmental consultantWalker - 4 wheels      Prior Function Level of Independence: Independent with assistive device(s)               Hand Dominance        Extremity/Trunk Assessment   Upper Extremity Assessment: Generalized weakness (grossly 3/5)           Lower Extremity Assessment: Generalized weakness (grossly 3+/5)         Communication   Communication: No difficulties  Cognition Arousal/Alertness: Awake/alert Behavior During Therapy: WFL for tasks assessed/performed Overall Cognitive Status: Within Functional Limits for tasks assessed                      General Comments      Exercises Other Exercises Other  Exercises: Supine ther-ex performed on B LE x 10 reps including ankle pumps, SLRs, and hip abd/add. Exercises performed with min assist. Fatigue noted with exercises.      Assessment/Plan    PT Assessment Patient needs continued PT services  PT Diagnosis Difficulty walking;Generalized weakness   PT Problem List Decreased strength;Decreased activity tolerance;Decreased balance;Decreased mobility  PT Treatment Interventions Gait training;Therapeutic exercise   PT Goals (Current goals can be found in the Care Plan section) Acute Rehab PT Goals Patient Stated Goal: to get stronger PT  Goal Formulation: With patient Time For Goal Achievement: 09/27/15 Potential to Achieve Goals: Good    Frequency Min 2X/week   Barriers to discharge        Co-evaluation               End of Session Equipment Utilized During Treatment: Gait belt Activity Tolerance: Patient tolerated treatment well Patient left: in bed;with bed alarm set Nurse Communication: Mobility status         Time: 1610-9604 PT Time Calculation (min) (ACUTE ONLY): 19 min   Charges:   PT Evaluation $PT Eval Low Complexity: 1 Procedure PT Treatments $Therapeutic Exercise: 8-22 mins   PT G Codes:        Leahann Lempke 09/25/2015, 12:03 PM  Elizabeth Palau, PT, DPT [phone]

## 2015-09-13 NOTE — Clinical Social Work Placement (Signed)
   CLINICAL SOCIAL WORK PLACEMENT  NOTE  Date:  09/13/2015  Patient Details  Name: Cheryl Wilson MRN: [id number] Date of Birth: 10/04/1927  Clinical Social Work is seeking post-discharge placement for this patient at the Skilled  Nursing Facility level of care (*CSW will initial, date and re-position this form in  chart as items are completed):  Yes   Patient/family provided with [place] Clinical Social Work Department's list of facilities offering this level of care within the geographic area requested by the patient (or if unable, by the patient's family).  Yes   Patient/family informed of their freedom to choose among providers that offer the needed level of care, that participate in Medicare, Medicaid or managed care program needed by the patient, have an available bed and are willing to accept the patient.  Yes   Patient/family informed of [facility]'s ownership interest in [facility] and [facility], as well as of the fact that they are under no obligation to receive care at these facilities.  PASRR submitted to EDS on 09/13/15     PASRR number received on 09/13/15     Existing PASRR number confirmed on       FL2 transmitted to all facilities in geographic area requested by pt/family on 09/13/15     FL2 transmitted to all facilities within larger geographic area on       Patient informed that his/her managed care company has contracts with or will negotiate with certain facilities, including the following:            Patient/family informed of bed offers received.  Patient chooses bed at       Physician recommends and patient chooses bed at      Patient to be transferred to   on  .  Patient to be transferred to facility by       Patient family notified on   of transfer.  Name of family member notified:        PHYSICIAN       Additional Comment:    _______________________________________________ Idamae Lusherhristina E Teodora Baumgarten, LCSW 09/13/2015, 4:30  PM

## 2015-09-13 NOTE — ED Notes (Signed)
Pt unable to stand due to weakness, placed on the bedpan to void per ED tech.

## 2015-09-13 NOTE — Care Management Note (Signed)
Case Management Note  Patient Details  Name: Etheleen NicksLillian Rebecca Eberlin MRN: [id number] Date of Birth: 10-24-1927  Subjective/Objective:                 Patient admitted form home with UTI.  Patient lives at home alone.  Patient states "I don't like living by myself, but I don't like living anywhere else."  Patient is alert and oriented.  Niece and Nephew at bedside.  [facility]. Patient states that she has a rolling walker and cane in the home.  Patient does not drive, family members transport her when needed.  PT is currently recommending SNF.  CSW notified.  RNCM following for discharge planning   Action/Plan:   Expected Discharge Date:                  Expected Discharge Plan:     In-House Referral:     Discharge planning Services     Post Acute Care Choice:    Choice offered to:     DME Arranged:    DME Agency:     HH Arranged:    HH Agency:     Status of Service:     Medicare Important Message Given:    Date Medicare IM Given:    Medicare IM give by:    Date Additional Medicare IM Given:    Additional Medicare Important Message give by:     If discussed at Long Length of Stay Meetings, dates discussed:    Additional Comments:  Chapman FitchBOWEN, Aaliya Maultsby T, RN 09/13/2015, 1:17 PM

## 2015-09-13 NOTE — NC FL2 (Signed)
[place] MEDICAID FL2 LEVEL OF CARE SCREENING TOOL     IDENTIFICATION  Patient Name: Cheryl Wilson Birthdate: [birth year] Sex: female Admission Date (Current Location): 09/12/2015  [place] and [place] Number:  Chiropodist and Address:  [facility], [address], [place], [place] [address]      Provider Number: [id number]  Attending Physician Name and Address:  Enid Baas, MD  Relative Name and Phone Number:       Current Level of Care: SNF Recommended Level of Care: Skilled Nursing Facility Prior Approval Number:    Date Approved/Denied:   PASRR Number:  [id number])  Discharge Plan: SNF    Current Diagnoses:  Diagnosis Date  . Dementia   . Diabetes mellitus without complication (HCC)   . Hyperlipemia   . Depression   . GERD (gastroesophageal reflux disease)   . Renal disorder   . CVA (cerebral infarction)   . Anxiety   . Parkinson disease (HCC)   . Thyroid disease   . Hypertension          Patient Active Problem List   Diagnosis Date Noted  . UTI (lower urinary tract infection) 09/13/2015    Orientation RESPIRATION BLADDER Height & Weight    Self, Time, Situation, Place  Normal Incontinent 5\' 6"  (167.6 cm) 147 lbs.  BEHAVIORAL SYMPTOMS/MOOD NEUROLOGICAL BOWEL NUTRITION STATUS   (None)  (None) Continent Diet (Carb Modified )  AMBULATORY STATUS COMMUNICATION OF NEEDS Skin   Extensive Assist Verbally Normal                       Personal Care Assistance Level of Assistance  Bathing, Feeding, Dressing Bathing Assistance: Limited assistance Feeding assistance: Independent Dressing Assistance: Limited assistance     Functional Limitations Info  Sight, Hearing, Speech Sight Info: Adequate Hearing Info: Impaired (Patient has impaired hearing in both ears. ) Speech Info: Adequate    SPECIAL CARE FACTORS FREQUENCY  PT (By licensed PT)     PT Frequency:   (5)              Contractures      Additional Factors Info  Code Status, Allergies, Insulin Sliding Scale Code Status Info:  (Full Code ) Allergies Info:  (Penicillins)   Insulin Sliding Scale Info:  (insulin aspart (novoLOG) injection 0-9 Units- 3 times daily with meals)       Current Medications (09/13/2015):  This is the current hospital active medication list Current Facility-Administered Medications  Medication Dose Route Frequency Provider Last Rate Last Dose  . 0.9 %  sodium chloride infusion   Intravenous Continuous Enid Baas, MD 75 mL/hr at 09/13/15 1328    . acetaminophen (TYLENOL) tablet 650 mg  650 mg Oral Q6H PRN Milagros Loll, MD   650 mg at 09/13/15 1329   Or  . acetaminophen (TYLENOL) suppository 650 mg  650 mg Rectal Q6H PRN Srikar Sudini, MD      . albuterol (PROVENTIL) (2.5 MG/3ML) 0.083% nebulizer solution 2.5 mg  2.5 mg Nebulization Q2H PRN Srikar Sudini, MD      . ALPRAZolam Prudy Feeler) tablet 0.25 mg  0.25 mg Oral QHS PRN Milagros Loll, MD      . aspirin EC tablet 81 mg  81 mg Oral Daily Milagros Loll, MD   81 mg at 09/13/15 0956  . bisacodyl (DULCOLAX) EC tablet 5 mg  5 mg Oral Daily PRN Milagros Loll, MD      . busPIRone (BUSPAR) tablet 5  mg  5 mg Oral BID Milagros Loll, MD   5 mg at 09/13/15 0955  . carbidopa-levodopa (SINEMET IR) 25-100 MG per tablet immediate release 1 tablet  1 tablet Oral TID Milagros Loll, MD   1 tablet at 09/13/15 0956  . cefTRIAXone (ROCEPHIN) 1 g in dextrose 5 % 50 mL IVPB  1 g Intravenous Q24H Cindi Carbon, [facility]      . clopidogrel (PLAVIX) tablet 75 mg  75 mg Oral Evern Bio, MD   75 mg at 09/13/15 5784  . colestipol (COLESTID) tablet 2 g  2 g Oral BID Milagros Loll, MD   2 g at 09/13/15 0954  . docusate sodium (COLACE) capsule 100 mg  100 mg Oral BID Milagros Loll, MD   100 mg at 09/13/15 0954  . donepezil (ARICEPT) tablet 5 mg  5 mg Oral QHS Srikar Sudini, MD      . enoxaparin (LOVENOX) injection 40 mg  40 mg  Subcutaneous Q24H Jodelle Red Swayne, RPH      . escitalopram (LEXAPRO) tablet 10 mg  10 mg Oral Evern Bio, MD   10 mg at 09/13/15 6962  . gabapentin (NEURONTIN) capsule 100 mg  100 mg Oral TID Milagros Loll, MD   100 mg at 09/13/15 0955  . insulin aspart (novoLOG) injection 0-5 Units  0-5 Units Subcutaneous QHS Milagros Loll, MD   0 Units at 09/13/15 0332  . insulin aspart (novoLOG) injection 0-9 Units  0-9 Units Subcutaneous TID WC Milagros Loll, MD   0 Units at 09/13/15 9528  . iron polysaccharides (NIFEREX) capsule 150 mg  150 mg Oral BID Milagros Loll, MD   150 mg at 09/13/15 0955  . levothyroxine (SYNTHROID, LEVOTHROID) tablet 25 mcg  25 mcg Oral QAC breakfast Milagros Loll, MD   25 mcg at 09/13/15 0955  . magnesium sulfate IVPB 2 g 50 mL  2 g Intravenous Once Enid Baas, MD      . meloxicam (MOBIC) tablet 7.5 mg  7.5 mg Oral QPM Srikar Sudini, MD      . metoCLOPramide (REGLAN) tablet 5 mg  5 mg Oral QID Milagros Loll, MD   5 mg at 09/13/15 0955  . mirabegron ER (MYRBETRIQ) tablet 25 mg  25 mg Oral Evern Bio, MD   25 mg at 09/13/15 4132  . ondansetron (ZOFRAN) tablet 4 mg  4 mg Oral Q6H PRN Milagros Loll, MD       Or  . ondansetron (ZOFRAN) injection 4 mg  4 mg Intravenous Q6H PRN Milagros Loll, MD   4 mg at 09/13/15 0526  . pantoprazole (PROTONIX) EC tablet 40 mg  40 mg Oral QAC breakfast Milagros Loll, MD   40 mg at 09/13/15 0956  . polyethylene glycol (MIRALAX / GLYCOLAX) packet 17 g  17 g Oral Daily PRN Srikar Sudini, MD      . primidone (MYSOLINE) tablet 50 mg  50 mg Oral BID Milagros Loll, MD   50 mg at 09/13/15 0955  . rosuvastatin (CRESTOR) tablet 10 mg  10 mg Oral QHS Srikar Sudini, MD      . selegiline (ELDEPRYL) tablet 5 mg  5 mg Oral BID WC Milagros Loll, MD   5 mg at 09/13/15 0956  . sodium phosphate (FLEET) 7-19 GM/118ML enema 1 enema  1 enema Rectal Once PRN Milagros Loll, MD         Discharge Medications: Please see discharge summary for a list of  discharge medications.  Relevant Imaging  Results:  Relevant Lab Results:   Additional Information  (SSN: [id number])  Verta Ellenhristina E Sunkins, LCSW

## 2015-09-13 NOTE — H&P (Signed)
[facility] - [place] at [facility]   PATIENT NAME: Cheryl Wilson    MR#:  [id number]  DATE OF BIRTH:  1927-11-01  DATE OF ADMISSION:  09/12/2015  PRIMARY CARE PHYSICIAN: Marguarite Arbour, MD   REQUESTING/REFERRING PHYSICIAN: Dr. York Cerise  CHIEF COMPLAINT:   Chief Complaint  Patient presents with  . Weakness    HISTORY OF PRESENT ILLNESS:  Cheryl Wilson  is a 82 y.o. female with a known history of Hypertension ,  diabetes, Parkinson's, dementia who walks with a 4 wheeled walker at baseline presents to the emergency room due to confusion and generalized weakness and unable to walk. Patient was seen in the emergency room had a CT scan of the head which showed nothing acute. Patient has improved while in the emergency room with confusion resolving close to normal. Patient still severely weak and unable to ambulate Being admitted to the hospitalist service. May need placement at skilled nursing facility. No recent antibiotic use. No dysphagia or change in vision. No focal sensory deficits.   PAST MEDICAL HISTORY:   Past Medical History  Diagnosis Date  . Dementia   . Diabetes mellitus without complication (HCC)   . Hyperlipemia   . Depression   . GERD (gastroesophageal reflux disease)   . Renal disorder   . CVA (cerebral infarction)   . Anxiety   . Parkinson disease (HCC)   . Thyroid disease   . Hypertension     PAST SURGICAL HISTORY:   Past Surgical History  Procedure Laterality Date  . Carotid endarterectomy    . Cholecystectomy      SOCIAL HISTORY:   Social History  Substance Use Topics  . Smoking status: Never Smoker   . Smokeless tobacco: Not on file  . Alcohol Use: No    FAMILY HISTORY:   Family History  Problem Relation Age of Onset  . Colon cancer Sister   . CAD Father     DRUG ALLERGIES:   Allergies  Allergen Reactions  . Penicillins Other (See Comments)    Reaction: Unknown  Has patient had a PCN reaction causing  immediate rash, facial/tongue/throat swelling, SOB or lightheadedness with hypotension: NO Has patient had a PCN reaction causing severe rash involving mucus membranes or skin necrosis: NO Has patient had a PCN reaction that required hospitalization: NO Has patient had a PCN reaction occurring within the last 10 years: NO If all of the above answers are "NO", then may proceed with Cephalosporin use.    REVIEW OF SYSTEMS:   Review of Systems  Constitutional: Positive for malaise/fatigue. Negative for fever, chills and weight loss.  HENT: Negative for hearing loss and nosebleeds.   Eyes: Negative for blurred vision, double vision and pain.  Respiratory: Negative for cough, hemoptysis, sputum production, shortness of breath and wheezing.   Cardiovascular: Negative for chest pain, palpitations, orthopnea and leg swelling.  Gastrointestinal: Negative for nausea, vomiting, abdominal pain, diarrhea and constipation.  Genitourinary: Positive for dysuria. Negative for hematuria.  Musculoskeletal: Negative for myalgias, back pain and falls.  Skin: Negative for rash.  Neurological: Positive for weakness. Negative for dizziness, tremors, sensory change, speech change, focal weakness, seizures and headaches.  Endo/Heme/Allergies: Does not bruise/bleed easily.  Psychiatric/Behavioral: Negative for depression and memory loss. The patient is not nervous/anxious.     MEDICATIONS AT HOME:   Prior to Admission medications   Medication Sig Start Date End Date Taking? Authorizing Provider  ALPRAZolam Prudy Feeler) 0.25 MG tablet Take 0.25 mg by mouth at bedtime  as needed for anxiety.   Yes Historical Provider, MD  busPIRone (BUSPAR) 5 MG tablet Take 5 mg by mouth 2 (two) times daily.   Yes Historical Provider, MD  carbidopa-levodopa (SINEMET IR) 25-100 MG tablet Take 1 tablet by mouth 3 (three) times daily.   Yes Historical Provider, MD  clopidogrel (PLAVIX) 75 MG tablet Take 75 mg by mouth every morning.   Yes  Historical Provider, MD  colestipol (COLESTID) 1 g tablet Take 2 g by mouth 2 (two) times daily.   Yes Historical Provider, MD  donepezil (ARICEPT) 5 MG tablet Take 5 mg by mouth at bedtime.   Yes Historical Provider, MD  escitalopram (LEXAPRO) 10 MG tablet Take 10 mg by mouth every morning.   Yes Historical Provider, MD  gabapentin (NEURONTIN) 100 MG capsule Take 100 mg by mouth 3 (three) times daily.   Yes Historical Provider, MD  iron polysaccharides (NIFEREX) 150 MG capsule Take 150 mg by mouth 2 (two) times daily.   Yes Historical Provider, MD  L-Methylfolate-Algae-B12-B6 Hebert Soho(FOLTANX RF) 3-90.314-2-35 MG CAPS Take 1 capsule by mouth every morning.   Yes Historical Provider, MD  lansoprazole (PREVACID) 15 MG capsule Take 15 mg by mouth every morning.   Yes Historical Provider, MD  levothyroxine (SYNTHROID, LEVOTHROID) 25 MCG tablet Take 25 mcg by mouth daily before breakfast.   Yes Historical Provider, MD  meloxicam (MOBIC) 7.5 MG tablet Take 7.5 mg by mouth every evening.   Yes Historical Provider, MD  metoCLOPramide (REGLAN) 5 MG tablet Take 5 mg by mouth 4 (four) times daily.   Yes Historical Provider, MD  mirabegron ER (MYRBETRIQ) 25 MG TB24 tablet Take 25 mg by mouth every morning.   Yes Historical Provider, MD  primidone (MYSOLINE) 50 MG tablet Take 50 mg by mouth 2 (two) times daily.   Yes Historical Provider, MD  rosuvastatin (CRESTOR) 10 MG tablet Take 10 mg by mouth at bedtime.   Yes Historical Provider, MD  selegiline (ELDEPRYL) 5 MG tablet Take 5 mg by mouth 2 (two) times daily with a meal. Take in morning and at lunch   Yes Historical Provider, MD  cephALEXin (KEFLEX) 500 MG capsule Take 1 capsule (500 mg total) by mouth 3 (three) times daily. 09/12/15   Loleta Roseory Forbach, MD      VITAL SIGNS:  Blood pressure 151/53, pulse 56, temperature 99.4 F (37.4 C), temperature source Oral, resp. rate 22, height 5\' 6"  (1.676 m), weight 73.8 kg (162 lb 11.2 oz), SpO2 100 %.  PHYSICAL EXAMINATION:   Physical Exam  GENERAL:  82 y.o.-year-old patient lying in the bed with no acute distress.  EYES: Pupils equal, round, reactive to light and accommodation. No scleral icterus. Extraocular muscles intact.  HEENT: Head atraumatic, normocephalic. Oropharynx and nasopharynx clear. No oropharyngeal erythema, moist oral mucosa  NECK:  Supple, no jugular venous distention. No thyroid enlargement, no tenderness.  LUNGS: Normal breath sounds bilaterally, no wheezing, rales, rhonchi. No use of accessory muscles of respiration.  CARDIOVASCULAR: S1, S2 normal. No murmurs, rubs, or gallops.  ABDOMEN: Soft, nontender, nondistended. Bowel sounds present. No organomegaly or mass.  EXTREMITIES: No pedal edema, cyanosis, or clubbing. + 2 pedal & radial pulses b/l.   NEUROLOGIC: Cranial nerves II through XII are intact. No focal Motor or sensory deficits appreciated b/l. Increased tone PSYCHIATRIC: The patient is alert and awake. SKIN: No obvious rash, lesion, or ulcer.   LABORATORY PANEL:   CBC  Recent Labs Lab 09/12/15 1748  WBC 10.3  HGB  10.1*  HCT 30.1*  PLT 212   ------------------------------------------------------------------------------------------------------------------  Chemistries   Recent Labs Lab 09/12/15 1748  NA 132*  K 4.0  CL 101  CO2 26  GLUCOSE 110*  BUN 21*  CREATININE 1.30*  CALCIUM 8.0*   ------------------------------------------------------------------------------------------------------------------  Cardiac Enzymes  Recent Labs Lab 09/12/15 1748  TROPONINI <0.03   ------------------------------------------------------------------------------------------------------------------  RADIOLOGY:  Ct Head Wo Contrast  09/12/2015  CLINICAL DATA:  Slurred speech.  Difficulty walking.  Headache. EXAM: CT HEAD WITHOUT CONTRAST TECHNIQUE: Contiguous axial images were obtained from the base of the skull through the vertex without intravenous contrast. COMPARISON:   Brain MRI 08/26/2014, head CT 07/08/2012 FINDINGS: Diffuse cerebral atrophy, stable in degree from prior exam. Rather extensive white matter disease, also stable and likely sequela of prior chronic small vessel ischemia. No intracranial hemorrhage, evidence of acute ischemia, or cerebral edema. No subdural or intracranial fluid collection. Atherosclerosis noted of the skullbase vasculature. Chronic opacification of lower left mastoid air cells. Paranasal sinuses are clear. No acute calvarial abnormality. IMPRESSION: Advanced atrophy and chronic small vessel ischemic change without CT findings of acute intracranial abnormality. The appearance is unchanged from prior exam. Electronically Signed   By: Rubye Oaks M.D.   On: 09/12/2015 19:51     IMPRESSION AND PLAN:   * UTI with severe weakness and unable to stand up No focal weakness on exam. Ct head showed nothing acute Start IV antibiotics and send for urine cultures. Physical therapy eval. May need SNF if slow improvement  * Parkinsons and dementia Continue home meds Monitor for inpatient delirium  * HTN Her medications were recently stopped by PCP do you to low normal BP  * DM SSI, ADA  * DVT prophylaxis Lovenox  Patient had ches pain a month back and seen in ED and has cardiology f/u as OP   All the records are reviewed and case discussed with ED provider. Management plans discussed with the patient, family and they are in agreement.  CODE STATUS: FULL  TOTAL TIME TAKING CARE OF THIS PATIENT: 40 minutes.    Milagros Loll R M.D on 09/13/2015 at 1:51 AM  Between 7am to 6pm - Pager - [phone]  After 6pm go to [email or web address] - password [email or web address] [facility]  [place]  Office  [phone]  CC: Primary care physician; Marguarite Arbour, MD     Note: This dictation was prepared with Dragon dictation along with smaller phrase technology. Any transcriptional errors that result from this process are  unintentional.

## 2015-09-14 LAB — GLUCOSE, CAPILLARY
GLUCOSE-CAPILLARY: 92 mg/dL (ref 65–99)
GLUCOSE-CAPILLARY: 119 mg/dL — AB (ref 65–99)
Glucose-Capillary: 65 mg/dL (ref 65–99)
Glucose-Capillary: 104 mg/dL — ABNORMAL HIGH (ref 65–99)
Glucose-Capillary: 110 mg/dL — ABNORMAL HIGH (ref 65–99)

## 2015-09-14 LAB — URINE CULTURE: Special Requests: NORMAL

## 2015-09-14 LAB — BASIC METABOLIC PANEL
Anion gap: 5 (ref 5–15)
BUN: 17 mg/dL (ref 6–20)
CALCIUM: 8.4 mg/dL — AB (ref 8.9–10.3)
CHLORIDE: 108 mmol/L (ref 101–111)
CO2: 26 mmol/L (ref 22–32)
CREATININE: 1 mg/dL (ref 0.44–1.00)
GFR calc Af Amer: 58 mL/min — ABNORMAL LOW (ref >60)
GFR calc non Af Amer: 50 mL/min — ABNORMAL LOW (ref >60)
GLUCOSE: 79 mg/dL (ref 65–99)
Potassium: 4.2 mmol/L (ref 3.5–5.1)
Sodium: 139 mmol/L (ref 135–145)

## 2015-09-14 LAB — MAGNESIUM: MAGNESIUM: 1.6 mg/dL — AB (ref 1.7–2.4)

## 2015-09-14 MED ORDER — CEPHALEXIN 250 MG PO CAPS
250.0000 mg | ORAL_CAPSULE | Freq: Three times a day (TID) | ORAL | Status: DC
  Administered 2015-09-14 – 2015-09-15 (×4): 250 mg via ORAL
  Filled 2015-09-14 (×5): qty 1

## 2015-09-14 NOTE — Progress Notes (Signed)
[facility] -  at [facility]   PATIENT NAME: Cheryl HawsLillian Wilson    MR#:  [id number]  DATE OF BIRTH:  July 13, 1928  SUBJECTIVE:  CHIEF COMPLAINT:   Chief Complaint  Patient presents with  . Weakness   -Doing much better today. Mental status back to normal according to daughter. -Pleasantly confused, but very interactive and humorous at baseline - appetite improving -Physical therapy recommended rehabilitation  REVIEW OF SYSTEMS:  Review of Systems  Constitutional: Negative for fever and chills.  Respiratory: Negative for cough, shortness of breath and wheezing.   Cardiovascular: Negative for chest pain and palpitations.  Gastrointestinal: Negative for nausea, vomiting, abdominal pain, diarrhea and constipation.  Genitourinary: Negative for dysuria and frequency.  Neurological: Positive for weakness. Negative for dizziness, speech change, focal weakness, seizures and headaches.  Psychiatric/Behavioral: Negative for depression.    DRUG ALLERGIES:   Allergies  Allergen Reactions  . Penicillins Other (See Comments)    Reaction: Unknown  Has patient had a PCN reaction causing immediate rash, facial/tongue/throat swelling, SOB or lightheadedness with hypotension: NO Has patient had a PCN reaction causing severe rash involving mucus membranes or skin necrosis: NO Has patient had a PCN reaction that required hospitalization: NO Has patient had a PCN reaction occurring within the last 10 years: NO If all of the above answers are "NO", then may proceed with Cephalosporin use.    VITALS:  Blood pressure 168/44, pulse 58, temperature 98 F (36.7 C), temperature source Oral, resp. rate 18, height 5\' 6"  (1.676 m), weight 70.217 kg (154 lb 12.8 oz), SpO2 100 %.  PHYSICAL EXAMINATION:  Physical Exam  GENERAL:  82 y.o.-year-old patient lying in the bed with no acute distress.  EYES: Pupils equal, round, reactive to light and accommodation. No scleral  icterus. Extraocular muscles intact.  HEENT: Head atraumatic, normocephalic. Oropharynx and nasopharynx clear.  NECK:  Supple, no jugular venous distention. No thyroid enlargement, no tenderness.  LUNGS: Normal breath sounds bilaterally, no wheezing, rales,rhonchi or crepitation. No use of accessory muscles of respiration.  CARDIOVASCULAR: S1, S2 normal. No rubs, or gallops. 3/6 systolic murmur is present ABDOMEN: Soft, nontender, nondistended. Bowel sounds present. No organomegaly or mass.  EXTREMITIES: No pedal edema, cyanosis, or clubbing.  NEUROLOGIC: Cranial nerves II through XII are intact. Muscle strength 5/5 in all extremities. Sensation intact. Gait not checked. Following simple commands. PSYCHIATRIC: The patient is alert and oriented x 2-3 SKIN: No obvious rash, lesion, or ulcer.    LABORATORY PANEL:   CBC  Recent Labs Lab 09/13/15 0558  WBC 9.1  HGB 9.9*  HCT 29.4*  PLT 198   ------------------------------------------------------------------------------------------------------------------  Chemistries   Recent Labs Lab 09/13/15 0558 09/14/15 0619  NA 136 139  K 3.9 4.2  CL 104 108  CO2 25 26  GLUCOSE 100* 79  BUN 18 17  CREATININE 1.10* 1.00  CALCIUM 8.3* 8.4*  MG 1.2*  --    ------------------------------------------------------------------------------------------------------------------  Cardiac Enzymes  Recent Labs Lab 09/12/15 1748  TROPONINI <0.03   ------------------------------------------------------------------------------------------------------------------  RADIOLOGY:  Ct Head Wo Contrast  09/12/2015  CLINICAL DATA:  Slurred speech.  Difficulty walking.  Headache. EXAM: CT HEAD WITHOUT CONTRAST TECHNIQUE: Contiguous axial images were obtained from the base of the skull through the vertex without intravenous contrast. COMPARISON:  Brain MRI 08/26/2014, head CT 07/08/2012 FINDINGS: Diffuse cerebral atrophy, stable in degree from prior exam.  Rather extensive white matter disease, also stable and likely sequela of prior chronic small vessel ischemia. No intracranial hemorrhage,  evidence of acute ischemia, or cerebral edema. No subdural or intracranial fluid collection. Atherosclerosis noted of the skullbase vasculature. Chronic opacification of lower left mastoid air cells. Paranasal sinuses are clear. No acute calvarial abnormality. IMPRESSION: Advanced atrophy and chronic small vessel ischemic change without CT findings of acute intracranial abnormality. The appearance is unchanged from prior exam. Electronically Signed   By: Rubye Oaks M.D.   On: 09/12/2015 19:51    EKG:   Orders placed or performed during the hospital encounter of 09/12/15  . EKG 12-Lead  . EKG 12-Lead    ASSESSMENT AND PLAN:   82 year old elderly female with past medical history significant for Parkinson's disease, dementia, hypertension and diabetes mellitus presented with severe weakness and inability to stand up. Noted to have UTI.  #1 acute cystitis-urine cultures are growing Klebsiella. -On Rocephin-changed to Keflex for a total of 7 days -Gentle hydration as needed. -Monitor fevers and white count.  #2 falls-secondary to weakness from UTI likely. -Physical therapy consulted. -Rehabilitation at discharge  #3 hypomagnesemia-magnesium replaced.  #4 Parkinson's disease-continue home medications. Patient on primidone, Sinemet.  #5 dementia and depression with anxiety-continue home medications. Patient is stable at this time.  #6 DVT prophylaxis-on Lovenox   Physical therapy consult recommended she have today. Family looking into [facility]. Possible discharge tomorrow..   All the records are reviewed and case discussed with Care Management/Social Workerr. Management plans discussed with the patient, family and they are in agreement.  CODE STATUS: Full code  TOTAL TIME TAKING CARE OF THIS PATIENT: 37 minutes.   POSSIBLE D/C  TOMORROW, DEPENDING ON CLINICAL CONDITION.   Enid Baas M.D on 09/14/2015 at 2:21 PM  Between 7am to 6pm - Pager - [phone]  After 6pm go to [email or web address] - password [email or web address] [facility]  [place] [facility]  Office  [phone]  CC: Primary care physician; Marguarite Arbour, MD

## 2015-09-14 NOTE — Progress Notes (Signed)
Not completed. Left the following information with patient , son Jonny RuizJohn and his wife. Clifton JamesMelvin Albion Weatherholtz [phone]

## 2015-09-14 NOTE — Progress Notes (Signed)
Patient blood sugar 65 this morning.  Gave orange juice, will reassess sugar.

## 2015-09-14 NOTE — Care Management Obs Status (Signed)
MEDICARE OBSERVATION STATUS NOTIFICATION   Patient Details  Name: Cheryl Wilson MRN: [id number] Date of Birth: 06-18-1928   Medicare Observation Status Notification Given:  Yes Changed from inpatient to observation.  Copy sent to HIM    Chapman FitchBOWEN, Marisa Hufstetler T, RN 09/14/2015, 1:58 PM

## 2015-09-14 NOTE — Clinical Social Work Note (Signed)
Clinicial Child psychotherapistocial Worker provided bed offers and pt's [facility]. CSW updated facility. CSW will continue to follow to facilitate [facility] when medically stable.   Dede QuerySarah Nayleah Gamel, MSW, LCSW Clinical Social Worker  [phone]

## 2015-09-14 NOTE — Progress Notes (Signed)
Physical Therapy Treatment Patient Details Name: Cheryl NicksLillian Rebecca Wilson MRN: [id number] DOB: 05/20/1928 Today's Date: 09/14/2015    History of Present Illness Pt admitted for UTI with complaints of weakness and inability to walk. Pt with history of HTN, Pakinson's, dementia, depression, GERD, and anxiety. Pt with no history of falls    PT Comments    Pt is making good progress towards goals with ability to ambulate to recliner this date. Takes + 2 assist for safety and pt very anxious with all movement. Good endurance with there-ex. Pt fatigues quickly, unsafe for further mobility at this time.  Follow Up Recommendations  SNF     Equipment Recommendations       Recommendations for Other Services       Precautions / Restrictions Precautions Precautions: Fall Restrictions Weight Bearing Restrictions: No    Mobility  Bed Mobility Overal bed mobility: Needs Assistance Bed Mobility: Supine to Sit     Supine to sit: Mod assist     General bed mobility comments: assist for trunk stability and scooting out towards EOB.   Transfers Overall transfer level: Needs assistance Equipment used: Rolling walker (2 wheeled) Transfers: Sit to/from Stand Sit to Stand: Mod assist;+2 physical assistance         General transfer comment: sit<>Stand with B feet blocked as pt worried about socks slipping on floor. Post leaning present during initial standing, however improves with cues for anterior weight shifting.  Ambulation/Gait Ambulation/Gait assistance: Min assist;+2 physical assistance Ambulation Distance (Feet): 33 Feet Assistive device: Rolling walker (2 wheeled) Gait Pattern/deviations: Step-to pattern     General Gait Details: ambulation performed to recliner with cues for upright posture. Heavy manal cues for sequencing including stepping inside of rw. Pt fatigues with increased ambulation, unable to tolerate further distance.   Stairs            Wheelchair  Mobility    Modified Rankin (Stroke Patients Only)       Balance                                    Cognition Arousal/Alertness: Awake/alert Behavior During Therapy: WFL for tasks assessed/performed Overall Cognitive Status: Within Functional Limits for tasks assessed                      Exercises Other Exercises Other Exercises: supine ther-ex performed including 12 reps on B LE including ankle pumps, quad sets, SLRs, SAQ, and hip abd/add. All ther-ex performed with cga    General Comments        Pertinent Vitals/Pain Pain Assessment: No/denies pain    Home Living                      Prior Function            PT Goals (current goals can now be found in the care plan section) Acute Rehab PT Goals Patient Stated Goal: to get stronger PT Goal Formulation: With patient Time For Goal Achievement: 09/27/15 Potential to Achieve Goals: Good Progress towards PT goals: Progressing toward goals    Frequency  Min 2X/week    PT Plan Current plan remains appropriate    Co-evaluation             End of Session Equipment Utilized During Treatment: Gait belt Activity Tolerance: Patient tolerated treatment well Patient left: in chair;with chair alarm set  Time: 1610-9604 PT Time Calculation (min) (ACUTE ONLY): 24 min  Charges:  $Gait Training: 8-22 mins $Therapeutic Exercise: 8-22 mins                    G Codes:      Fateh Kindle 10-01-15, 5:02 PM Elizabeth Palau, PT, DPT [phone]

## 2015-09-14 NOTE — Progress Notes (Signed)
09/13/15 1145  PT Visit Information  Last PT Received On 09/13/15  Assistance Needed +2  History of Present Illness Pt admitted for UTI with complaints of weakness and inability to walk. Pt with history of HTN, Pakinson's, dementia, depression, GERD, and anxiety. Pt with no history of falls  Precautions  Precautions Fall  Restrictions  Weight Bearing Restrictions No  Home Living  Family/patient expects to be discharged to: Private residence  Living Arrangements Alone  Available Help at Discharge (has some family)  Type of Home House  Home Access (TBD, poor historian)  Home Equipment Walker - 4 wheels  Prior Function  Level of Independence Independent with assistive device(s)  Communication  Communication No difficulties  Pain Assessment  Pain Assessment Faces  Faces Pain Scale 6  Pain Location B UE trying to scoot up towards HOB  Pain Descriptors / Indicators Aching  Pain Intervention(s) Limited activity within patient's tolerance;Patient requesting pain meds-RN notified  Cognition  Arousal/Alertness Awake/alert  Behavior During Therapy WFL for tasks assessed/performed  Overall Cognitive Status Within Functional Limits for tasks assessed  Upper Extremity Assessment  Upper Extremity Assessment Generalized weakness (grossly 3/5)  Lower Extremity Assessment  Lower Extremity Assessment Generalized weakness (grossly 3+/5)  Bed Mobility  Overal bed mobility Needs Assistance  Bed Mobility Supine to Sit  Supine to sit Mod assist  General bed mobility comments assist for bed mobility including assist for scooting out towards EOB. Once seated, pt with very kyphotic posture and able to sit with supervision. +2 assist for sit->supine  Transfers  Overall transfer level Needs assistance  Equipment used Rolling walker (2 wheeled)  Transfers Sit to/from Stand  Sit to Stand Min assist  General transfer comment sit<>Stand with rw and safe technique. Pt with post leaning once standing  and only able to stand for 10 seconds prior to needing a break  Ambulation/Gait  General Gait Details unable to tolerate ambulation at this time  Balance  Overall balance assessment Needs assistance  Sitting-balance support Bilateral upper extremity supported;Feet supported  Sitting balance-Leahy Scale Good  Standing balance support Bilateral upper extremity supported  Standing balance-Leahy Scale Fair  Exercises  Exercises Other exercises  Other Exercises  Other Exercises Supine ther-ex performed on B LE x 10 reps including ankle pumps, SLRs, and hip abd/add. Exercises performed with min assist. Fatigue noted with exercises.  PT - End of Session  Equipment Utilized During Treatment Gait belt  Activity Tolerance Patient tolerated treatment well  Patient left in bed;with bed alarm set  Nurse Communication Mobility status  PT Assessment  PT Therapy Diagnosis  Difficulty walking;Generalized weakness  PT Recommendation/Assessment Patient needs continued PT services  PT Problem List Decreased strength;Decreased activity tolerance;Decreased balance;Decreased mobility  PT Plan  PT Frequency (ACUTE ONLY) Min 2X/week  PT Treatment/Interventions (ACUTE ONLY) Gait training;Therapeutic exercise  PT Recommendation  Follow Up Recommendations SNF  Individuals Consulted  Consulted and Agree with Results and Recommendations Patient  Acute Rehab PT Goals  Patient Stated Goal to get stronger  PT Goal Formulation With patient  Time For Goal Achievement 09/27/15  Potential to Achieve Goals Good  PT Time Calculation  PT Start Time (ACUTE ONLY) 0929  PT Stop Time (ACUTE ONLY) 0948  PT Time Calculation (min) (ACUTE ONLY) 19 min  PT G-Codes **NOT FOR INPATIENT CLASS**  Functional Assessment Tool Used clinical judgement  Functional Limitation Mobility: Walking and moving around  Mobility: Walking and Moving Around Current Status (R6045(G8978) CK  Mobility: Walking and Moving Around Goal  Status (W0981) CJ   PT General Charges  $$ ACUTE PT VISIT 1 Procedure  PT Evaluation  $PT Eval Low Complexity 1 Procedure  PT Treatments  $Therapeutic Exercise 8-22 mins  Late G code entry for 09/13/15.  Elizabeth Palau, PT, DPT [phone]

## 2015-09-15 LAB — GLUCOSE, CAPILLARY
Glucose-Capillary: 76 mg/dL (ref 65–99)
Glucose-Capillary: 88 mg/dL (ref 65–99)

## 2015-09-15 LAB — BASIC METABOLIC PANEL
ANION GAP: 7 (ref 5–15)
BUN: 15 mg/dL (ref 6–20)
CHLORIDE: 105 mmol/L (ref 101–111)
CO2: 25 mmol/L (ref 22–32)
Calcium: 8.5 mg/dL — ABNORMAL LOW (ref 8.9–10.3)
Creatinine, Ser: 0.9 mg/dL (ref 0.44–1.00)
GFR calc non Af Amer: 57 mL/min — ABNORMAL LOW (ref >60)
GLUCOSE: 103 mg/dL — AB (ref 65–99)
Potassium: 4.6 mmol/L (ref 3.5–5.1)
Sodium: 137 mmol/L (ref 135–145)

## 2015-09-15 MED ORDER — BISACODYL 5 MG PO TBEC: 5.0000 mg | DELAYED_RELEASE_TABLET | Freq: Every day | ORAL | Status: AC | PRN

## 2015-09-15 MED ORDER — DOCUSATE SODIUM 100 MG PO CAPS: 100.0000 mg | ORAL_CAPSULE | Freq: Two times a day (BID) | ORAL | Status: AC

## 2015-09-15 MED ORDER — ASPIRIN 81 MG PO TBEC: 81.0000 mg | DELAYED_RELEASE_TABLET | Freq: Every day | ORAL | Status: AC

## 2015-09-15 MED ORDER — CEPHALEXIN 250 MG PO CAPS: 250.0000 mg | ORAL_CAPSULE | Freq: Three times a day (TID) | ORAL | Status: AC

## 2015-09-15 NOTE — Clinical Social Work Placement (Signed)
   CLINICAL SOCIAL WORK PLACEMENT  NOTE  Date:  09/15/2015  Patient Details  Name: Cheryl Wilson MRN: [id number] Date of Birth: [birth year]  Clinical Social Work is seeking post-discharge placement for this patient at the Skilled  Nursing Facility level of care (*CSW will initial, date and re-position this form in  chart as items are completed):  Yes   Patient/family provided with [facility] Clinical Social Work Department's list of facilities offering this level of care within the geographic area requested by the patient (or if unable, by the patient's family).  Yes   Patient/family informed of their freedom to choose among providers that offer the needed level of care, that participate in Medicare, Medicaid or managed care program needed by the patient, have an available bed and are willing to accept the patient.  Yes   Patient/family informed of [facility]'s ownership interest in [facility] and [facility], as well as of the fact that they are under no obligation to receive care at these facilities.  PASRR submitted to EDS on 09/13/15     PASRR number received on 09/13/15     Existing PASRR number confirmed on       FL2 transmitted to all facilities in geographic area requested by pt/family on 09/13/15     FL2 transmitted to all facilities within larger geographic area on       Patient informed that his/her managed care company has contracts with or will negotiate with certain facilities, including the following:        Yes   Patient/family informed of bed offers received.  Patient chooses bed at [facility]     Physician recommends and patient chooses bed at  [facility](SNF)    Patient to be transferred to [facility] on 09/15/15.  Patient to be [facility]     Patient family notified on 09/15/15 of transfer.  Name of family member notified:  Jonny RuizJohn, son and daughter in law     PHYSICIAN        Additional Comment:    _______________________________________________ Dede QuerySarah Alizandra Loh, LCSW 09/15/2015, 4:11 PM

## 2015-09-15 NOTE — Discharge Summary (Signed)
[facility] [place] at [facility]  DISCHARGE SUMMARY   PATIENT NAME: Cheryl Wilson    MR#:  [id number]  DATE OF BIRTH:  [birth year]  DATE OF ADMISSION:  09/12/2015 ADMITTING PHYSICIAN: Milagros Loll, MD  DATE OF DISCHARGE: 09/15/2015  PRIMARY CARE PHYSICIAN: SPARKS,JEFFREY D, MD    ADMISSION DIAGNOSIS:  Generalized weakness [R53.1] Urinary tract infection without hematuria, site unspecified [N39.0]  DISCHARGE DIAGNOSIS:  Active Problems:   UTI (lower urinary tract infection)   SECONDARY DIAGNOSIS:   Past Medical History  Diagnosis Date  . Dementia   . Diabetes mellitus without complication (HCC)   . Hyperlipemia   . Depression   . GERD (gastroesophageal reflux disease)   . Renal disorder   . CVA (cerebral infarction)   . Anxiety   . Parkinson disease (HCC)   . Thyroid disease   . Hypertension     HOSPITAL COURSE:   82 year old elderly female with past medical history significant for Parkinson's disease, dementia, hypertension and diabetes mellitus presented with severe weakness and inability to stand up. Noted to have UTI.  #1 acute cystitis-urine cultures are growing pansensitive Klebsiella. Has been treated with Rocephin during hospitalization. She will be changed to Keflex after discharge for a total of 7 days of treatment. No fever or leukocytosis. No problems with urination.  #2 falls-secondary to weakness from UTI likely. Physical therapy has seen her she will need SNF at discharge for rehabilitation.  #3 hypomagnesemia-magnesium replaced.  #4 Parkinson's disease-continue home medications. Patient on primidone, Sinemet.  #5 dementia and depression with anxiety-continue home medications. Patient is stable at this time.  DISCHARGE CONDITIONS:   Stable  CONSULTS OBTAINED:    None  DRUG ALLERGIES:   Allergies  Allergen Reactions  . Penicillins Other (See Comments)    Reaction: Unknown  Has patient had a PCN reaction  causing immediate rash, facial/tongue/throat swelling, SOB or lightheadedness with hypotension: NO Has patient had a PCN reaction causing severe rash involving mucus membranes or skin necrosis: NO Has patient had a PCN reaction that required hospitalization: NO Has patient had a PCN reaction occurring within the last 10 years: NO If all of the above answers are "NO", then may proceed with Cephalosporin use.    DISCHARGE MEDICATIONS:   Current Discharge Medication List    START taking these medications   Details  aspirin EC 81 MG EC tablet Take 1 tablet (81 mg total) by mouth daily. Qty: 30 tablet, Refills: 0    bisacodyl (DULCOLAX) 5 MG EC tablet Take 1 tablet (5 mg total) by mouth daily as needed for moderate constipation. Qty: 30 tablet, Refills: 0    cephALEXin (KEFLEX) 250 MG capsule Take 1 capsule (250 mg total) by mouth 3 (three) times daily. Qty: 12 capsule, Refills: 0    docusate sodium (COLACE) 100 MG capsule Take 1 capsule (100 mg total) by mouth 2 (two) times daily. Qty: 10 capsule, Refills: 0      CONTINUE these medications which have NOT CHANGED   Details  ALPRAZolam (XANAX) 0.25 MG tablet Take 0.25 mg by mouth at bedtime as needed for anxiety.    busPIRone (BUSPAR) 5 MG tablet Take 5 mg by mouth 2 (two) times daily.    carbidopa-levodopa (SINEMET IR) 25-100 MG tablet Take 1 tablet by mouth 3 (three) times daily.    clopidogrel (PLAVIX) 75 MG tablet Take 75 mg by mouth every morning.    colestipol (COLESTID) 1 g tablet Take 2 g by mouth 2 (  two) times daily.    donepezil (ARICEPT) 5 MG tablet Take 5 mg by mouth at bedtime.    escitalopram (LEXAPRO) 10 MG tablet Take 10 mg by mouth every morning.    gabapentin (NEURONTIN) 100 MG capsule Take 100 mg by mouth 3 (three) times daily.    iron polysaccharides (NIFEREX) 150 MG capsule Take 150 mg by mouth 2 (two) times daily.    L-Methylfolate-Algae-B12-B6 (FOLTANX RF) 3-90.314-2-35 MG CAPS Take 1 capsule by mouth  every morning.    lansoprazole (PREVACID) 15 MG capsule Take 15 mg by mouth every morning.    levothyroxine (SYNTHROID, LEVOTHROID) 25 MCG tablet Take 25 mcg by mouth daily before breakfast.    meloxicam (MOBIC) 7.5 MG tablet Take 7.5 mg by mouth every evening.    metoCLOPramide (REGLAN) 5 MG tablet Take 5 mg by mouth 4 (four) times daily.    mirabegron ER (MYRBETRIQ) 25 MG TB24 tablet Take 25 mg by mouth every morning.    primidone (MYSOLINE) 50 MG tablet Take 50 mg by mouth 2 (two) times daily.    rosuvastatin (CRESTOR) 10 MG tablet Take 10 mg by mouth at bedtime.    selegiline (ELDEPRYL) 5 MG tablet Take 5 mg by mouth 2 (two) times daily with a meal. Take in morning and at lunch         DISCHARGE INSTRUCTIONS:    DIET:  Cardiac diet  DISCHARGE CONDITION:  Stable  ACTIVITY:  Activity as tolerated-per physical therapy recommendations  OXYGEN:  Home Oxygen: No.   Oxygen Delivery: room air  DISCHARGE LOCATION:  nursing home   If you experience worsening of your admission symptoms, develop shortness of breath, life threatening emergency, suicidal or homicidal thoughts you must seek medical attention immediately by calling 911 or calling your MD immediately  if symptoms less severe.  You Must read complete instructions/literature along with all the possible adverse reactions/side effects for all the Medicines you take and that have been prescribed to you. Take any new Medicines after you have completely understood and accpet all the possible adverse reactions/side effects.   Please note  You were cared for by a hospitalist during your hospital stay. If you have any questions about your discharge medications or the care you received while you were in the hospital after you are discharged, you can call the unit and asked to speak with the hospitalist on call if the hospitalist that took care of you is not available. Once you are discharged, your primary care physician will  handle any further medical issues. Please note that NO REFILLS for any discharge medications will be authorized once you are discharged, as it is imperative that you return to your primary care physician (or establish a relationship with a primary care physician if you do not have one) for your aftercare needs so that they can reassess your need for medications and monitor your lab values.    Today   CHIEF COMPLAINT:   Chief Complaint  Patient presents with  . Weakness    HISTORY OF PRESENT ILLNESS:   Cheryl Wilson is a 82 y.o. female with a known history of Hypertension , diabetes, Parkinson's, dementia who walks with a 4 wheeled walker at baseline presents to the emergency room due to confusion and generalized weakness and unable to walk. Patient was seen in the emergency room had a CT scan of the head which showed nothing acute. Patient has improved while in the emergency room with confusion resolving close to normal. Patient  still severely weak and unable to ambulate Being admitted to the hospitalist service. May need placement at skilled nursing facility. No recent antibiotic use. No dysphagia or change in vision. No focal sensory deficits.   VITAL SIGNS:  Blood pressure 133/41, pulse 50, temperature 98.1 F (36.7 C), temperature source Oral, resp. rate 14, height 5\' 6"  (1.676 m), weight 71.305 kg (157 lb 3.2 oz), SpO2 99 %.  I/O:   Intake/Output Summary (Last 24 hours) at 09/15/15 1110 Last data filed at 09/15/15 0954  Gross per 24 hour  Intake   1890 ml  Output      0 ml  Net   1890 ml    PHYSICAL EXAMINATION:  GENERAL:  82 y.o.-year-old patient lying in the bed with no acute distress.  LUNGS: Normal breath sounds bilaterally, no wheezing, rales,rhonchi or crepitation. No use of accessory muscles of respiration.  CARDIOVASCULAR: S1, S2 normal. No murmurs, rubs, or gallops.  ABDOMEN: Soft, non-tender, non-distended. Bowel sounds present. No organomegaly or mass.   EXTREMITIES: No pedal edema, cyanosis, or clubbing.  NEUROLOGIC: Cranial nerves II through XII are intact. Muscle strength 5/5 in all extremities. Sensation intact. Gait not checked.  PSYCHIATRIC: The patient is alert and oriented x 3.  SKIN: No obvious rash, lesion, or ulcer.   DATA REVIEW:   CBC  Recent Labs Lab 09/13/15 0558  WBC 9.1  HGB 9.9*  HCT 29.4*  PLT 198    Chemistries   Recent Labs Lab 09/14/15 1459 09/15/15 0909  NA  --  137  K  --  4.6  CL  --  105  CO2  --  25  GLUCOSE  --  103*  BUN  --  15  CREATININE  --  0.90  CALCIUM  --  8.5*  MG 1.6*  --     Cardiac Enzymes  Recent Labs Lab 09/12/15 1748  TROPONINI <0.03    Microbiology Results  Results for orders placed or performed during the hospital encounter of 09/12/15  Urine culture     Status: None   Collection Time: 09/12/15 10:57 PM  Result Value Ref Range Status   Specimen Description URINE, RANDOM  Final   Special Requests Normal  Final   Culture >=100,000 COLONIES/mL KLEBSIELLA PNEUMONIAE  Final   Report Status 09/14/2015 FINAL  Final   Organism ID, Bacteria KLEBSIELLA PNEUMONIAE  Final      Susceptibility   Klebsiella pneumoniae - MIC*    AMPICILLIN 16 RESISTANT Resistant     CEFTAZIDIME <=1 SENSITIVE Sensitive     CEFAZOLIN <=4 SENSITIVE Sensitive     CEFTRIAXONE <=1 SENSITIVE Sensitive     CIPROFLOXACIN <=0.25 SENSITIVE Sensitive     GENTAMICIN <=1 SENSITIVE Sensitive     IMIPENEM <=0.25 SENSITIVE Sensitive     TRIMETH/SULFA <=20 SENSITIVE Sensitive     Extended ESBL NEGATIVE Sensitive     PIP/TAZO Value in next row Sensitive      SENSITIVE<=4    ERTAPENEM Value in next row Sensitive      SENSITIVE<=0.5    LEVOFLOXACIN Value in next row Sensitive      SENSITIVE<=0.12    NITROFURANTOIN Value in next row Sensitive      SENSITIVE32    * >=100,000 COLONIES/mL KLEBSIELLA PNEUMONIAE    RADIOLOGY:  No results found.  EKG:   Orders placed or performed during the hospital  encounter of 09/12/15  . EKG 12-Lead  . EKG 12-Lead      Management plans discussed with the  patient, family and they are in agreement.  CODE STATUS:     Code Status Orders        Start     Ordered   09/13/15 0149  Full code   Continuous     09/13/15 0149    Advance Directive Documentation        Most Recent Value   Type of Advance Directive  Healthcare Power of Attorney, Living will   Pre-existing out of facility DNR order (yellow form or pink MOST form)     "MOST" Form in Place?        TOTAL TIME TAKING CARE OF THIS PATIENT: 35 minutes.  Greater than 50% of time spent in care coordination and counseling.  Elby ShowersWALSH, CATHERINE M.D on 09/15/2015 at 11:10 AM  Between 7am to 6pm - Pager - [phone]  After 6pm go to [email or web address] - password [email or web address]  [facility]  Office  [phone]  CC: Primary care physician; Marguarite ArbourSPARKS,JEFFREY D, MD

## 2015-09-15 NOTE — Clinical Social Work Peds Assess (Signed)
[facility] is [facility] for [facility] today [facility]. Facility has received discharge information and is ready to admit pt. CSW updated pt's daughter in law, who will update pt's son Jonny RuizJohn. Pt is agreeable and aware. RN to call report and EMS to provide transportation. CSW will continue to follow.   Dede QuerySarah Tagan Bartram, MSW, LCSW  Clinical Social Worker  [phone]

## 2015-09-30 ENCOUNTER — Ambulatory Visit: Payer: Medicare Other | Admitting: Cardiovascular Disease

## 2015-10-11 ENCOUNTER — Ambulatory Visit: Payer: Medicare Other | Admitting: Cardiovascular Disease

## 2015-10-12 ENCOUNTER — Encounter: Payer: Self-pay | Admitting: Internal Medicine

## 2015-10-18 ENCOUNTER — Encounter: Payer: Self-pay | Admitting: Urology

## 2015-10-18 ENCOUNTER — Ambulatory Visit (INDEPENDENT_AMBULATORY_CARE_PROVIDER_SITE_OTHER): Payer: Medicare Other | Admitting: Urology

## 2015-10-18 VITALS — BP 156/67 | HR 67 | Ht 60.0 in | Wt 149.2 lb

## 2015-10-18 DIAGNOSIS — N952 Postmenopausal atrophic vaginitis: Secondary | ICD-10-CM | POA: Diagnosis not present

## 2015-10-18 DIAGNOSIS — N390 Urinary tract infection, site not specified: Secondary | ICD-10-CM

## 2015-10-18 LAB — URINALYSIS, COMPLETE
Bilirubin, UA: NEGATIVE
GLUCOSE, UA: NEGATIVE
KETONES UA: NEGATIVE
LEUKOCYTES UA: NEGATIVE
Nitrite, UA: NEGATIVE
PROTEIN UA: NEGATIVE
Specific Gravity, UA: 1.01 (ref 1.005–1.030)
Urobilinogen, Ur: 0.2 mg/dL (ref 0.2–1.0)
pH, UA: 5.5 (ref 5.0–7.5)

## 2015-10-18 LAB — MICROSCOPIC EXAMINATION: Bacteria, UA: NONE SEEN

## 2015-10-18 LAB — BLADDER SCAN AMB NON-IMAGING: Scan Result: 26

## 2015-10-18 MED ORDER — ESTROGENS, CONJUGATED 0.625 MG/GM VA CREA: 1.0000 | TOPICAL_CREAM | Freq: Every day | VAGINAL | Status: AC

## 2015-10-18 MED ORDER — ESTRADIOL 0.1 MG/GM VA CREA: TOPICAL_CREAM | VAGINAL | Status: AC

## 2015-10-18 NOTE — Progress Notes (Addendum)
10/18/2015 10:19 PM   Etheleen Nicks 09-Sep-1928 [id number]  Referring provider: Marguarite Arbour, MD [address] [facility] [place], [place] [address]  Chief Complaint  Patient presents with  . Recurrent UTI    referred by Dr. Judithann Sheen    HPI: Patient is an 82 year old Caucasian fem for further evaluation and management.ale with a history of recurrent urinary tract infections who is referred by her primary care physician, Dr. Judithann Sheen for further evaluation and management.  Patient has had 5 TIA's and is a poor historian. She presents today with her daughter.  Patient's daughter states that she has been suffering with recurrent urinary tract infections, frequent urination, vaginal soreness and dysuria.    Her baseline urinary symptoms consist of frequency, urgency, nocturia, incontinence, intermittency and hesitancy.  She has had two documented infections.  One in 12/2014 for beta hemolytic Streptococcus, group B and Escherichia coli.  The next one on 10/06/2015 for Klebsiella.    Today, she is experiencing vaginal discomfort and burning. Her UA today is unremarkable. Her PVR is 26 mL.  She is currently on Myrbetriq 25 mg daily.  PMH: Past Medical History  Diagnosis Date  . Dementia   . Diabetes mellitus without complication (HCC)   . Hyperlipemia   . Depression   . GERD (gastroesophageal reflux disease)   . Renal disorder   . CVA (cerebral infarction)   . Anxiety   . Parkinson disease (HCC)   . Thyroid disease   . Hypertension   . Arthritis   . Heart murmur   . TIA (transient ischemic attack)     x 5  . Kidney stone     Surgical History: Past Surgical History  Procedure Laterality Date  . Carotid endarterectomy    . Cholecystectomy    . Tonsillectomy and adenoidectomy      Home Medications:    Medication List       This list is accurate as of: 10/18/15 11:59 PM.  Always use your most recent med list.               ALPRAZolam  0.25 MG tablet  Commonly known as:  XANAX  Take 0.25 mg by mouth at bedtime as needed for anxiety.     amLODipine 5 MG tablet  Commonly known as:  NORVASC  Take by mouth.     aspirin 81 MG EC tablet  Take 1 tablet (81 mg total) by mouth daily.     bisacodyl 5 MG EC tablet  Commonly known as:  DULCOLAX  Take 1 tablet (5 mg total) by mouth daily as needed for moderate constipation.     busPIRone 5 MG tablet  Commonly known as:  BUSPAR  Take 5 mg by mouth 2 (two) times daily. Reported on 10/18/2015     carbidopa-levodopa 25-100 MG tablet  Commonly known as:  SINEMET IR  Take 1 tablet by mouth 3 (three) times daily.     clopidogrel 75 MG tablet  Commonly known as:  PLAVIX  Take 75 mg by mouth every morning.     colestipol 1 g tablet  Commonly known as:  COLESTID  Take 2 g by mouth 2 (two) times daily.     conjugated estrogens vaginal cream  Commonly known as:  PREMARIN  Place vaginally.     conjugated estrogens vaginal cream  Commonly known as:  PREMARIN  Place 1 Applicatorful vaginally daily. Apply 0.5mg  (pea-sized amount)  just inside the vaginal introitus with  a finger-tip every night for two weeks and then Monday, Wednesday and Friday nights.     cyclobenzaprine 5 MG tablet  Commonly known as:  FLEXERIL  Take by mouth. Reported on 10/18/2015     docusate sodium 100 MG capsule  Commonly known as:  COLACE  Take 1 capsule (100 mg total) by mouth 2 (two) times daily.     donepezil 5 MG tablet  Commonly known as:  ARICEPT  Take 5 mg by mouth at bedtime.     doxycycline 100 MG tablet  Commonly known as:  VIBRA-TABS  Take by mouth.     escitalopram 10 MG tablet  Commonly known as:  LEXAPRO  Take 10 mg by mouth every morning.     estradiol 0.1 MG/GM vaginal cream  Commonly known as:  ESTRACE VAGINAL  Apply 0.5mg  (pea-sized amount)  just inside the vaginal introitus with a finger-tip every night for two weeks and then Monday, Wednesday and Friday nights.     ferrous  sulfate 325 (65 FE) MG tablet  Take by mouth.     FOLTANX RF 3-90.314-2-35 MG Caps  Take 1 capsule by mouth every morning.     gabapentin 100 MG capsule  Commonly known as:  NEURONTIN  Take 100 mg by mouth 3 (three) times daily.     HYDROcodone-acetaminophen 5-325 MG tablet  Commonly known as:  NORCO/VICODIN  Take by mouth. Reported on 10/18/2015     iron polysaccharides 150 MG capsule  Commonly known as:  NIFEREX  Take 150 mg by mouth 2 (two) times daily.     L-METHYLFOLATE CALCIUM PO  Take by mouth.     lansoprazole 15 MG capsule  Commonly known as:  PREVACID  Take 15 mg by mouth every morning.     levothyroxine 25 MCG tablet  Commonly known as:  SYNTHROID, LEVOTHROID  Take 25 mcg by mouth daily before breakfast.     Lidocaine & Menthol-Methyl Sal 5 & 3-10 % Thpk  Reported on 10/18/2015     meloxicam 7.5 MG tablet  Commonly known as:  MOBIC  Take 7.5 mg by mouth every evening.     metoCLOPramide 5 MG tablet  Commonly known as:  REGLAN  Take 5 mg by mouth 4 (four) times daily.     MYRBETRIQ 25 MG Tb24 tablet  Generic drug:  mirabegron ER  Take 25 mg by mouth every morning.     ondansetron 4 MG tablet  Commonly known as:  ZOFRAN  Take by mouth.     predniSONE 10 MG tablet  Commonly known as:  DELTASONE  Reported on 10/18/2015     primidone 50 MG tablet  Commonly known as:  MYSOLINE  Take 50 mg by mouth 2 (two) times daily.     rosuvastatin 10 MG tablet  Commonly known as:  CRESTOR  Take 10 mg by mouth at bedtime.     selegiline 5 MG tablet  Commonly known as:  ELDEPRYL  Take 5 mg by mouth 2 (two) times daily with a meal. Take in morning and at lunch        Allergies:  Allergies  Allergen Reactions  . Amoxicillin Itching  . Chlorphen-Phenyleph-Asa Other (See Comments)    tachycardia  . Erythromycin Itching  . Penicillins Other (See Comments)    Reaction: Unknown  Has patient had a PCN reaction causing immediate rash, facial/tongue/throat swelling,  SOB or lightheadedness with hypotension: NO Has patient had a PCN reaction causing severe rash involving mucus  membranes or skin necrosis: NO Has patient had a PCN reaction that required hospitalization: NO Has patient had a PCN reaction occurring within the last 10 years: NO If all of the above answers are "NO", then may proceed with Cephalosporin use.  . Sulfa Antibiotics Itching    Family History: Family History  Problem Relation Age of Onset  . Colon cancer Sister   . CAD Father   . Tuberculosis Sister     and grandson  . Cancer Brother   . Epilepsy Brother   . Leukemia Sister   . Parkinson's disease Sister   . Kidney disease Neg Hx   . Bladder Cancer Neg Hx     Social History:  reports that she has quit smoking. She does not have any smokeless tobacco history on file. She reports that she does not drink alcohol or use illicit drugs.  ROS: UROLOGY Frequent Urination?: Yes Hard to postpone urination?: Yes Burning/pain with urination?: Yes Get up at night to urinate?: Yes Leakage of urine?: Yes Urine stream starts and stops?: Yes Trouble starting stream?: Yes Do you have to strain to urinate?: No Blood in urine?: Yes Urinary tract infection?: Yes Sexually transmitted disease?: No Injury to kidneys or bladder?: No Painful intercourse?: No Weak stream?: No Currently pregnant?: No Vaginal bleeding?: No Last menstrual period?: n  Gastrointestinal Nausea?: Yes Vomiting?: Yes Indigestion/heartburn?: No Diarrhea?: Yes Constipation?: Yes  Constitutional Fever: No Night sweats?: Yes Weight loss?: No Fatigue?: Yes  Skin Skin rash/lesions?: No Itching?: Yes  Eyes Blurred vision?: No Double vision?: No  Ears/Nose/Throat Sore throat?: No Sinus problems?: Yes  Hematologic/Lymphatic Swollen glands?: No Easy bruising?: No  Cardiovascular Leg swelling?: Yes Chest pain?: No  Respiratory Cough?: No Shortness of breath?: Yes  Endocrine Excessive  thirst?: No  Musculoskeletal Back pain?: Yes Joint pain?: Yes  Neurological Headaches?: Yes Dizziness?: No  Psychologic Depression?: Yes Anxiety?: Yes  Physical Exam: BP 156/67 mmHg  Pulse 67  Ht 5' (1.524 m)  Wt 149 lb 3.2 oz (67.677 kg)  BMI 29.14 kg/m2  Constitutional: Well nourished. Alert and oriented, No acute distress. HEENT: [place] AT, moist mucus membranes. Trachea midline, no masses. Cardiovascular: No clubbing, cyanosis, or edema. Respiratory: Normal respiratory effort, no increased work of breathing. GI: Abdomen is soft, non tender, non distended, no abdominal masses. Liver and spleen not palpable.  No hernias appreciated.  Stool sample for occult testing is not indicated.   GU: No CVA tenderness.  No bladder fullness or masses.  Atrophic external genitalia, normal pubic hair distribution, no lesions.  Normal urethral meatus, no lesions, no prolapse, no discharge.   No urethral masses, tenderness and/or tenderness. No bladder fullness, tenderness or masses. Patient with lichen sclerosis of the vaginal walls.  Introitus is narrow.  There is an area of purple scarring where a previous biopsy had been taken to confirm the lichen sclerosis.   Anus and perineum are without rashes or lesions.    Skin: No rashes, bruises or suspicious lesions. Lymph: No cervical or inguinal adenopathy. Neurologic: Grossly intact, no focal deficits, moving all 4 extremities. Psychiatric: Normal mood and affect.   Laboratory Data: Lab Results  Component Value Date   WBC 9.1 09/13/2015   HGB 9.9* 09/13/2015   HCT 29.4* 09/13/2015   MCV 84.9 09/13/2015   PLT 198 09/13/2015   Lab Results  Component Value Date   CREATININE 0.90 09/15/2015   Lab Results  Component Value Date   AST 39* 07/11/2012   Lab Results  Component Value Date   ALT 17 07/11/2012    Urinalysis Results for orders placed or performed in visit on 10/18/15  Microscopic Examination  Result Value Ref Range   WBC,  UA 0-5 0 -  5 /hpf   RBC, UA 0-2 0 -  2 /hpf   Epithelial Cells (non renal) 0-10 0 - 10 /hpf   Bacteria, UA None seen None seen/Few  Urinalysis, Complete  Result Value Ref Range   Specific Gravity, UA 1.010 1.005 - 1.030   pH, UA 5.5 5.0 - 7.5   Color, UA Yellow Yellow   Appearance Ur Clear Clear   Leukocytes, UA Negative Negative   Protein, UA Negative Negative/Trace   Glucose, UA Negative Negative   Ketones, UA Negative Negative   RBC, UA Trace (A) Negative   Bilirubin, UA Negative Negative   Urobilinogen, Ur 0.2 0.2 - 1.0 mg/dL   Nitrite, UA Negative Negative   Microscopic Examination See below:     Pertinent Imaging: Results for NOVIE, MAGGIO (MRN [id number]) as of 10/23/2015 22:02  Ref. Range 10/18/2015 14:57  Scan Result Unknown 26    Assessment & Plan:    1. Recurrent UTI:   Patient with 2 documented urinary tract infections over the last year. We will continue to monitor. Her UA is unremarkable today.  - Urinalysis, Complete - BLADDER SCAN AMB NON-IMAGING  2. Vaginal atrophy:   Patient was given a sample of vaginal estrogen cream and instructed to apply 0.5mg  (pea-sized amount)  just inside the vaginal introitus with a finger-tip every night for two weeks and then Monday, Wednesday and Friday nights.  I explained to the patient that vaginally administered estrogen, which causes only a slight increase in the blood estrogen levels, have fewer contraindications and adverse systemic effects that oral HT.   Return in about 2 weeks (around 11/01/2015) for exam.  These notes generated with voice recognition software. I apologize for typographical errors.  Michiel Cowboy, PA-C  [facility] [address], [address] [place], [place] [address] [phone]

## 2015-10-23 DIAGNOSIS — N390 Urinary tract infection, site not specified: Secondary | ICD-10-CM | POA: Insufficient documentation

## 2015-10-23 DIAGNOSIS — N952 Postmenopausal atrophic vaginitis: Secondary | ICD-10-CM | POA: Insufficient documentation

## 2015-11-01 ENCOUNTER — Ambulatory Visit (INDEPENDENT_AMBULATORY_CARE_PROVIDER_SITE_OTHER): Payer: Medicare Other | Admitting: Urology

## 2015-11-01 ENCOUNTER — Encounter: Payer: Self-pay | Admitting: Urology

## 2015-11-01 VITALS — BP 178/67 | HR 78 | Ht 60.0 in | Wt 149.5 lb

## 2015-11-01 DIAGNOSIS — N390 Urinary tract infection, site not specified: Secondary | ICD-10-CM | POA: Diagnosis not present

## 2015-11-01 DIAGNOSIS — N952 Postmenopausal atrophic vaginitis: Secondary | ICD-10-CM

## 2015-11-01 NOTE — Progress Notes (Signed)
3:35 PM   Cheryl Wilson 02/27/1928 [id number]  Referring provider: Marguarite Arbour, MD [address] [facility] [place], [place] [address]  Chief Complaint  Patient presents with  . Vaginitis    2 weeks follow up     HPI: Patient is an 82 year old Caucasian female who is referred to [place] for recurrent urinary tract infections who presents today after being placed on vaginal estrogen cream for atrophic vaginitis.  Previous history Patient with a history of recurrent urinary tract infections who has had 5 TIA's and is a poor historian. She presents today with her daughter.  Patient's daughter states that she has been suffering with recurrent urinary tract infections, frequent urination, vaginal soreness and dysuria.  Her baseline urinary symptoms consist of frequency, urgency, nocturia, incontinence, intermittency and hesitancy.  She has had two documented infections.  One in 12/2014 for beta hemolytic Streptococcus, group B and Escherichia coli.  The next one on 10/06/2015 for Klebsiella.    Today, she states the vaginal cream isn't working. She states it is burning her.  She states that she has been applying it twice daily since we last seen her.  She is with her caregiver today who stated that she would like to reorganize her perineal creams so that the patient knows which cream to apply and when.    She is not experiencing symptoms of urinary tract infections at this visit.    PMH: Past Medical History  Diagnosis Date  . Dementia   . Diabetes mellitus without complication (HCC)   . Hyperlipemia   . Depression   . GERD (gastroesophageal reflux disease)   . Renal disorder   . CVA (cerebral infarction)   . Anxiety   . Parkinson disease (HCC)   . Thyroid disease   . Hypertension   . Arthritis   . Heart murmur   . TIA (transient ischemic attack)     x 5  . Kidney stone     Surgical History: Past Surgical History  Procedure Laterality Date  .  Carotid endarterectomy    . Cholecystectomy    . Tonsillectomy and adenoidectomy      Home Medications:    Medication List       This list is accurate as of: 11/01/15 11:59 PM.  Always use your most recent med list.               ALPRAZolam 0.25 MG tablet  Commonly known as:  XANAX  Take 0.25 mg by mouth at bedtime as needed for anxiety.     amLODipine 5 MG tablet  Commonly known as:  NORVASC  Take by mouth.     aspirin 81 MG EC tablet  Take 1 tablet (81 mg total) by mouth daily.     bisacodyl 5 MG EC tablet  Commonly known as:  DULCOLAX  Take 1 tablet (5 mg total) by mouth daily as needed for moderate constipation.     busPIRone 5 MG tablet  Commonly known as:  BUSPAR  Take 5 mg by mouth 2 (two) times daily. Reported on 10/18/2015     carbidopa-levodopa 25-100 MG tablet  Commonly known as:  SINEMET IR  Take 1 tablet by mouth 3 (three) times daily.     clopidogrel 75 MG tablet  Commonly known as:  PLAVIX  Take 75 mg by mouth every morning.     colestipol 1 g tablet  Commonly known as:  COLESTID  Take 2 g by  mouth 2 (two) times daily.     conjugated estrogens vaginal cream  Commonly known as:  PREMARIN  Place vaginally. Reported on 11/01/2015     conjugated estrogens vaginal cream  Commonly known as:  PREMARIN  Place 1 Applicatorful vaginally daily. Apply 0.5mg  (pea-sized amount)  just inside the vaginal introitus with a finger-tip every night for two weeks and then Monday, Wednesday and Friday nights.     docusate sodium 100 MG capsule  Commonly known as:  COLACE  Take 1 capsule (100 mg total) by mouth 2 (two) times daily.     donepezil 5 MG tablet  Commonly known as:  ARICEPT  Take 5 mg by mouth at bedtime.     escitalopram 10 MG tablet  Commonly known as:  LEXAPRO  Take 10 mg by mouth every morning.     estradiol 0.1 MG/GM vaginal cream  Commonly known as:  ESTRACE VAGINAL  Apply 0.5mg  (pea-sized amount)  just inside the vaginal introitus with a  finger-tip every night for two weeks and then Monday, Wednesday and Friday nights.     ferrous sulfate 325 (65 FE) MG tablet  Take by mouth.     FOLTANX RF 3-90.314-2-35 MG Caps  Take 1 capsule by mouth every morning. Reported on 11/01/2015     gabapentin 100 MG capsule  Commonly known as:  NEURONTIN  Take 100 mg by mouth 3 (three) times daily.     HYDROcodone-acetaminophen 5-325 MG tablet  Commonly known as:  NORCO/VICODIN  Take by mouth. Reported on 10/18/2015     iron polysaccharides 150 MG capsule  Commonly known as:  NIFEREX  Take 150 mg by mouth 2 (two) times daily.     L-METHYLFOLATE CALCIUM PO  Take by mouth.     lansoprazole 15 MG capsule  Commonly known as:  PREVACID  Take 15 mg by mouth every morning.     levothyroxine 25 MCG tablet  Commonly known as:  SYNTHROID, LEVOTHROID  Take 25 mcg by mouth daily before breakfast.     meloxicam 7.5 MG tablet  Commonly known as:  MOBIC  Take 7.5 mg by mouth every evening.     metoCLOPramide 5 MG tablet  Commonly known as:  REGLAN  Take 5 mg by mouth 4 (four) times daily.     MYRBETRIQ 25 MG Tb24 tablet  Generic drug:  mirabegron ER  Take 25 mg by mouth every morning.     ondansetron 4 MG tablet  Commonly known as:  ZOFRAN  Take by mouth.     predniSONE 10 MG tablet  Commonly known as:  DELTASONE  Reported on 11/01/2015     primidone 50 MG tablet  Commonly known as:  MYSOLINE  Take 50 mg by mouth 2 (two) times daily. Reported on 11/01/2015     rosuvastatin 10 MG tablet  Commonly known as:  CRESTOR  Take 10 mg by mouth at bedtime.     selegiline 5 MG tablet  Commonly known as:  ELDEPRYL  Take 5 mg by mouth 2 (two) times daily with a meal. Take in morning and at lunch        Allergies:  Allergies  Allergen Reactions  . Amoxicillin Itching  . Chlorphen-Phenyleph-Asa Other (See Comments)    tachycardia  . Erythromycin Itching  . Penicillins Other (See Comments)    Reaction: Unknown  Has patient had a  PCN reaction causing immediate rash, facial/tongue/throat swelling, SOB or lightheadedness with hypotension: NO Has patient had a PCN  reaction causing severe rash involving mucus membranes or skin necrosis: NO Has patient had a PCN reaction that required hospitalization: NO Has patient had a PCN reaction occurring within the last 10 years: NO If all of the above answers are "NO", then may proceed with Cephalosporin use.  . Sulfa Antibiotics Itching    Family History: Family History  Problem Relation Age of Onset  . Colon cancer Sister   . CAD Father   . Tuberculosis Sister     and grandson  . Cancer Brother   . Epilepsy Brother   . Leukemia Sister   . Parkinson's disease Sister   . Kidney disease Neg Hx   . Bladder Cancer Neg Hx     Social History:  reports that she has quit smoking. She does not have any smokeless tobacco history on file. She reports that she does not drink alcohol or use illicit drugs.  ROS: UROLOGY Frequent Urination?: Yes Hard to postpone urination?: Yes Burning/pain with urination?: No Get up at night to urinate?: Yes Leakage of urine?: No Urine stream starts and stops?: No Trouble starting stream?: No Do you have to strain to urinate?: No Blood in urine?: No Urinary tract infection?: No Sexually transmitted disease?: No Injury to kidneys or bladder?: No Painful intercourse?: No Weak stream?: No Currently pregnant?: No Vaginal bleeding?: No Last menstrual period?: n  Gastrointestinal Nausea?: No Vomiting?: No Indigestion/heartburn?: No Diarrhea?: No Constipation?: No  Constitutional Fever: No Night sweats?: Yes Weight loss?: No Fatigue?: No  Skin Skin rash/lesions?: No Itching?: Yes  Eyes Blurred vision?: No Double vision?: No  Ears/Nose/Throat Sore throat?: No Sinus problems?: No  Hematologic/Lymphatic Swollen glands?: No Easy bruising?: Yes  Cardiovascular Leg swelling?: Yes Chest pain?: No  Respiratory Cough?:  No Shortness of breath?: No  Endocrine Excessive thirst?: No  Musculoskeletal Back pain?: Yes Joint pain?: No  Neurological Headaches?: No Dizziness?: No  Psychologic Depression?: Yes Anxiety?: Yes  Physical Exam: BP 178/67 mmHg  Pulse 78  Ht 5' (1.524 m)  Wt 149 lb 8 oz (67.813 kg)  BMI 29.20 kg/m2  Constitutional: Well nourished. Alert and oriented, No acute distress. HEENT: [place] AT, moist mucus membranes. Trachea midline, no masses. Cardiovascular: No clubbing, cyanosis, or edema. Respiratory: Normal respiratory effort, no increased work of breathing. GI: Abdomen is soft, non tender, non distended, no abdominal masses. Liver and spleen not palpable.  No hernias appreciated.  Stool sample for occult testing is not indicated.   GU: No CVA tenderness.  No bladder fullness or masses.  Atrophic external genitalia, normal pubic hair distribution, no lesions.  Normal urethral meatus, no lesions, no prolapse, no discharge.   No urethral masses, tenderness and/or tenderness. No bladder fullness, tenderness or masses. Patient with lichen sclerosis of the vaginal walls.  Introitus is narrow.  There is an area of purple scarring where a previous biopsy had been taken to confirm the lichen sclerosis.   Anus and perineum are without rashes or lesions.    Skin: No rashes, bruises or suspicious lesions. Lymph: No cervical or inguinal adenopathy. Neurologic: Grossly intact, no focal deficits, moving all 4 extremities. Psychiatric: Normal mood and affect.  Laboratory Data: Lab Results  Component Value Date   WBC 9.1 09/13/2015   HGB 9.9* 09/13/2015   HCT 29.4* 09/13/2015   MCV 84.9 09/13/2015   PLT 198 09/13/2015   Lab Results  Component Value Date   CREATININE 0.90 09/15/2015   Lab Results  Component Value Date   AST 39* 07/11/2012  Lab Results  Component Value Date   ALT 17 07/11/2012    Urinalysis Results for orders placed or performed in visit on 10/18/15  Microscopic  Examination  Result Value Ref Range   WBC, UA 0-5 0 -  5 /hpf   RBC, UA 0-2 0 -  2 /hpf   Epithelial Cells (non renal) 0-10 0 - 10 /hpf   Bacteria, UA None seen None seen/Few  Urinalysis, Complete  Result Value Ref Range   Specific Gravity, UA 1.010 1.005 - 1.030   pH, UA 5.5 5.0 - 7.5   Color, UA Yellow Yellow   Appearance Ur Clear Clear   Leukocytes, UA Negative Negative   Protein, UA Negative Negative/Trace   Glucose, UA Negative Negative   Ketones, UA Negative Negative   RBC, UA Trace (A) Negative   Bilirubin, UA Negative Negative   Urobilinogen, Ur 0.2 0.2 - 1.0 mg/dL   Nitrite, UA Negative Negative   Microscopic Examination See below:     Pertinent Imaging: Results for JAHMIA, BERRETT (MRN [id number]) as of 10/23/2015 22:02  Ref. Range 10/18/2015 14:57  Scan Result Unknown 26    Assessment & Plan:    1. Recurrent UTI:   Patient with 2 documented urinary tract infections over the last year. We will continue to monitor.  She is to contact our office if she should experience any symptoms of urinary tract infections.  - BLADDER SCAN AMB NON-IMAGING  2. Vaginal atrophy:   Patient will continue the vaginal estrogen cream. She will apply it 3 times weekly and return in 3 months for exam and symptom recheck,    Return in about 3 months (around 01/29/2016) for exam .  These notes generated with voice recognition software. I apologize for typographical errors.  Michiel Cowboy, PA-C  [facility] [address], [address] [place], [place] [address] [phone]

## 2015-11-29 ENCOUNTER — Encounter: Payer: Self-pay | Admitting: Emergency Medicine

## 2015-11-29 ENCOUNTER — Ambulatory Visit: Admission: RE | Admit: 2015-11-29 | Payer: Medicare Other | Source: Ambulatory Visit

## 2015-11-29 ENCOUNTER — Emergency Department: Payer: Medicare Other

## 2015-11-29 DIAGNOSIS — R778 Other specified abnormalities of plasma proteins: Secondary | ICD-10-CM

## 2015-11-29 DIAGNOSIS — Z880 Allergy status to penicillin: Secondary | ICD-10-CM

## 2015-11-29 DIAGNOSIS — E785 Hyperlipidemia, unspecified: Secondary | ICD-10-CM | POA: Diagnosis present

## 2015-11-29 DIAGNOSIS — I4891 Unspecified atrial fibrillation: Secondary | ICD-10-CM | POA: Diagnosis present

## 2015-11-29 DIAGNOSIS — Z8249 Family history of ischemic heart disease and other diseases of the circulatory system: Secondary | ICD-10-CM

## 2015-11-29 DIAGNOSIS — I739 Peripheral vascular disease, unspecified: Secondary | ICD-10-CM | POA: Insufficient documentation

## 2015-11-29 DIAGNOSIS — N390 Urinary tract infection, site not specified: Secondary | ICD-10-CM | POA: Diagnosis present

## 2015-11-29 DIAGNOSIS — Z87440 Personal history of urinary (tract) infections: Secondary | ICD-10-CM

## 2015-11-29 DIAGNOSIS — G8929 Other chronic pain: Secondary | ICD-10-CM | POA: Diagnosis present

## 2015-11-29 DIAGNOSIS — G20 Parkinson's disease: Secondary | ICD-10-CM | POA: Diagnosis present

## 2015-11-29 DIAGNOSIS — Z7902 Long term (current) use of antithrombotics/antiplatelets: Secondary | ICD-10-CM

## 2015-11-29 DIAGNOSIS — R531 Weakness: Secondary | ICD-10-CM | POA: Insufficient documentation

## 2015-11-29 DIAGNOSIS — F329 Major depressive disorder, single episode, unspecified: Secondary | ICD-10-CM | POA: Diagnosis present

## 2015-11-29 DIAGNOSIS — Z791 Long term (current) use of non-steroidal anti-inflammatories (NSAID): Secondary | ICD-10-CM

## 2015-11-29 DIAGNOSIS — F0390 Unspecified dementia without behavioral disturbance: Secondary | ICD-10-CM | POA: Diagnosis present

## 2015-11-29 DIAGNOSIS — Z800 Family history of malignant neoplasm of digestive organs: Secondary | ICD-10-CM

## 2015-11-29 DIAGNOSIS — Z7982 Long term (current) use of aspirin: Secondary | ICD-10-CM

## 2015-11-29 DIAGNOSIS — Z820 Family history of epilepsy and other diseases of the nervous system: Secondary | ICD-10-CM

## 2015-11-29 DIAGNOSIS — K5900 Constipation, unspecified: Secondary | ICD-10-CM | POA: Diagnosis present

## 2015-11-29 DIAGNOSIS — I213 ST elevation (STEMI) myocardial infarction of unspecified site: Principal | ICD-10-CM | POA: Diagnosis present

## 2015-11-29 DIAGNOSIS — I10 Essential (primary) hypertension: Secondary | ICD-10-CM | POA: Diagnosis present

## 2015-11-29 DIAGNOSIS — Z8673 Personal history of transient ischemic attack (TIA), and cerebral infarction without residual deficits: Secondary | ICD-10-CM

## 2015-11-29 DIAGNOSIS — E871 Hypo-osmolality and hyponatremia: Secondary | ICD-10-CM | POA: Diagnosis not present

## 2015-11-29 DIAGNOSIS — F419 Anxiety disorder, unspecified: Secondary | ICD-10-CM | POA: Diagnosis present

## 2015-11-29 DIAGNOSIS — R7989 Other specified abnormal findings of blood chemistry: Secondary | ICD-10-CM | POA: Diagnosis present

## 2015-11-29 DIAGNOSIS — R112 Nausea with vomiting, unspecified: Secondary | ICD-10-CM

## 2015-11-29 DIAGNOSIS — R1110 Vomiting, unspecified: Secondary | ICD-10-CM | POA: Insufficient documentation

## 2015-11-29 DIAGNOSIS — E079 Disorder of thyroid, unspecified: Secondary | ICD-10-CM | POA: Diagnosis present

## 2015-11-29 DIAGNOSIS — R6884 Jaw pain: Secondary | ICD-10-CM | POA: Diagnosis present

## 2015-11-29 DIAGNOSIS — R600 Localized edema: Secondary | ICD-10-CM | POA: Diagnosis present

## 2015-11-29 DIAGNOSIS — Z87442 Personal history of urinary calculi: Secondary | ICD-10-CM

## 2015-11-29 DIAGNOSIS — N179 Acute kidney failure, unspecified: Secondary | ICD-10-CM | POA: Diagnosis present

## 2015-11-29 DIAGNOSIS — Z87891 Personal history of nicotine dependence: Secondary | ICD-10-CM

## 2015-11-29 DIAGNOSIS — N289 Disorder of kidney and ureter, unspecified: Secondary | ICD-10-CM | POA: Diagnosis present

## 2015-11-29 DIAGNOSIS — K5909 Other constipation: Secondary | ICD-10-CM | POA: Diagnosis present

## 2015-11-29 DIAGNOSIS — B9620 Unspecified Escherichia coli [E. coli] as the cause of diseases classified elsewhere: Secondary | ICD-10-CM | POA: Diagnosis present

## 2015-11-29 DIAGNOSIS — R1084 Generalized abdominal pain: Secondary | ICD-10-CM | POA: Insufficient documentation

## 2015-11-29 DIAGNOSIS — I2510 Atherosclerotic heart disease of native coronary artery without angina pectoris: Secondary | ICD-10-CM | POA: Diagnosis present

## 2015-11-29 DIAGNOSIS — Z881 Allergy status to other antibiotic agents status: Secondary | ICD-10-CM

## 2015-11-29 DIAGNOSIS — K219 Gastro-esophageal reflux disease without esophagitis: Secondary | ICD-10-CM | POA: Diagnosis present

## 2015-11-29 DIAGNOSIS — M1990 Unspecified osteoarthritis, unspecified site: Secondary | ICD-10-CM | POA: Diagnosis present

## 2015-11-29 DIAGNOSIS — I700 Atherosclerosis of aorta: Secondary | ICD-10-CM | POA: Diagnosis present

## 2015-11-29 DIAGNOSIS — Z79899 Other long term (current) drug therapy: Secondary | ICD-10-CM

## 2015-11-29 DIAGNOSIS — E1151 Type 2 diabetes mellitus with diabetic peripheral angiopathy without gangrene: Secondary | ICD-10-CM | POA: Diagnosis present

## 2015-11-29 DIAGNOSIS — Z888 Allergy status to other drugs, medicaments and biological substances status: Secondary | ICD-10-CM

## 2015-11-29 DIAGNOSIS — I459 Conduction disorder, unspecified: Secondary | ICD-10-CM | POA: Diagnosis present

## 2015-11-29 DIAGNOSIS — Z806 Family history of leukemia: Secondary | ICD-10-CM

## 2015-11-29 DIAGNOSIS — Z79890 Hormone replacement therapy (postmenopausal): Secondary | ICD-10-CM

## 2015-11-29 LAB — BASIC METABOLIC PANEL
ANION GAP: 11 (ref 5–15)
BUN: 28 mg/dL — ABNORMAL HIGH (ref 6–20)
CHLORIDE: 98 mmol/L — AB (ref 101–111)
CO2: 18 mmol/L — AB (ref 22–32)
Calcium: 8.2 mg/dL — ABNORMAL LOW (ref 8.9–10.3)
Creatinine, Ser: 1.35 mg/dL — ABNORMAL HIGH (ref 0.44–1.00)
GFR calc Af Amer: 40 mL/min — ABNORMAL LOW (ref >60)
GFR, EST NON AFRICAN AMERICAN: 35 mL/min — AB (ref >60)
GLUCOSE: 150 mg/dL — AB (ref 65–99)
POTASSIUM: 4.5 mmol/L (ref 3.5–5.1)
Sodium: 127 mmol/L — ABNORMAL LOW (ref 135–145)

## 2015-11-29 LAB — CBC
HEMATOCRIT: 27.9 % — AB (ref 35.0–47.0)
HEMOGLOBIN: 8.9 g/dL — AB (ref 12.0–16.0)
MCH: 27.5 pg (ref 26.0–34.0)
MCHC: 32 g/dL (ref 32.0–36.0)
MCV: 86.1 fL (ref 80.0–100.0)
Platelets: 270 10*3/uL (ref 150–440)
RBC: 3.24 MIL/uL — ABNORMAL LOW (ref 3.80–5.20)
RDW: 15.4 % — ABNORMAL HIGH (ref 11.5–14.5)
WBC: 11.2 10*3/uL — ABNORMAL HIGH (ref 3.6–11.0)

## 2015-11-29 LAB — LIPASE, BLOOD: Lipase: 35 U/L (ref 11–51)

## 2015-11-29 MED ORDER — IOHEXOL 240 MG/ML SOLN
25.0000 mL | Freq: Once | INTRAMUSCULAR | Status: AC | PRN
  Administered 2015-11-29: 25 mL via INTRAVENOUS

## 2015-11-29 MED ORDER — IOHEXOL 300 MG/ML  SOLN
75.0000 mL | Freq: Once | INTRAMUSCULAR | Status: AC | PRN
  Administered 2015-11-29: 75 mL via INTRAVENOUS

## 2015-11-29 MED ORDER — SODIUM CHLORIDE 0.9 % IV BOLUS (SEPSIS)
1000.0000 mL | Freq: Once | INTRAVENOUS | Status: AC
  Administered 2015-11-29: 1000 mL via INTRAVENOUS

## 2015-11-29 MED ORDER — ACETAMINOPHEN 325 MG PO TABS
650.0000 mg | ORAL_TABLET | Freq: Once | ORAL | Status: AC
  Administered 2015-11-29: 650 mg via ORAL

## 2015-11-29 MED ORDER — ACETAMINOPHEN 325 MG PO TABS
ORAL_TABLET | ORAL | Status: AC
  Filled 2015-11-29: qty 2

## 2015-11-29 NOTE — ED Notes (Signed)
Patient transported to CT 

## 2015-11-29 NOTE — ED Provider Notes (Signed)
[facility] Emergency Department Provider Note  ____________________________________________  Time seen: Approximately 9:46 PM  I have reviewed the triage vital signs and the nursing notes.   HISTORY  Chief Complaint No chief complaint on file.    HPI Cheryl Wilson is a 82 y.o. female with history of dependence, diabetes, hyperlipidemia, GERD who presents for evaluation of abdominal pain, vomiting since yesterday, gradual onset, constant, currently mild to moderate. The patient reports that she was attempting to treat her constipation at home with several different over-the-counter medications including MiraLAX and magnesium citrate. After she took the magnesium citrate she had one episode nonbloody nonbilious emesis. She has had a few bowel movements but has persistent pain. She has felt generally weak but is still ambulatory with her walker. She denies any chest pain or difficulty breathing.   Past Medical History  Diagnosis Date  . Dementia   . Diabetes mellitus without complication (HCC)   . Hyperlipemia   . Depression   . GERD (gastroesophageal reflux disease)   . Renal disorder   . CVA (cerebral infarction)   . Anxiety   . Parkinson disease (HCC)   . Thyroid disease   . Hypertension   . Arthritis   . Heart murmur   . TIA (transient ischemic attack)     x 5  . Kidney stone     Patient Active Problem List   Diagnosis Date Noted  . Recurrent UTI 10/23/2015  . Atrophic vaginitis 10/23/2015  . UTI (lower urinary tract infection) 09/13/2015    Past Surgical History  Procedure Laterality Date  . Carotid endarterectomy    . Cholecystectomy    . Tonsillectomy and adenoidectomy      Current Outpatient Rx  Name  Route  Sig  Dispense  Refill  . ALPRAZolam (XANAX) 0.25 MG tablet   Oral   Take 0.25 mg by mouth at bedtime as needed for anxiety.         Marland Kitchen amLODipine (NORVASC) 5 MG tablet   Oral   Take 5 mg by mouth daily.           Marland Kitchen aspirin EC 81 MG EC tablet   Oral   Take 1 tablet (81 mg total) by mouth daily.   30 tablet   0   . bisacodyl (DULCOLAX) 5 MG EC tablet   Oral   Take 1 tablet (5 mg total) by mouth daily as needed for moderate constipation.   30 tablet   0   . busPIRone (BUSPAR) 5 MG tablet   Oral   Take 5 mg by mouth 2 (two) times daily. Reported on 10/18/2015         . carbidopa-levodopa (SINEMET IR) 25-100 MG tablet   Oral   Take 1 tablet by mouth 3 (three) times daily.         . clopidogrel (PLAVIX) 75 MG tablet   Oral   Take 75 mg by mouth every morning.         . colestipol (COLESTID) 1 g tablet   Oral   Take 2 g by mouth 2 (two) times daily.         Marland Kitchen docusate sodium (COLACE) 100 MG capsule   Oral   Take 1 capsule (100 mg total) by mouth 2 (two) times daily.   10 capsule   0   . donepezil (ARICEPT) 5 MG tablet   Oral   Take 5 mg by mouth at bedtime.         Marland Kitchen  escitalopram (LEXAPRO) 10 MG tablet   Oral   Take 10 mg by mouth every morning.         . ferrous sulfate 325 (65 FE) MG tablet   Oral   Take 325 mg by mouth daily with breakfast.          . gabapentin (NEURONTIN) 100 MG capsule   Oral   Take 100 mg by mouth 3 (three) times daily.         . iron polysaccharides (NIFEREX) 150 MG capsule   Oral   Take 150 mg by mouth 2 (two) times daily.         Marland Kitchen L-Methylfolate-Algae-B12-B6 (FOLTANX RF) 3-90.314-2-35 MG CAPS   Oral   Take 1 capsule by mouth every morning. Reported on 11/01/2015         . lansoprazole (PREVACID) 15 MG capsule   Oral   Take 15 mg by mouth every morning.         Marland Kitchen levothyroxine (SYNTHROID, LEVOTHROID) 25 MCG tablet   Oral   Take 25 mcg by mouth daily before breakfast.         . meloxicam (MOBIC) 7.5 MG tablet   Oral   Take 7.5 mg by mouth every evening.         . metoCLOPramide (REGLAN) 5 MG tablet   Oral   Take 5 mg by mouth 4 (four) times daily.         . mirabegron ER (MYRBETRIQ) 25 MG TB24 tablet    Oral   Take 25 mg by mouth every morning.         . primidone (MYSOLINE) 50 MG tablet   Oral   Take 50 mg by mouth 2 (two) times daily. Reported on 11/01/2015         . rosuvastatin (CRESTOR) 10 MG tablet   Oral   Take 10 mg by mouth at bedtime.         . selegiline (ELDEPRYL) 5 MG tablet   Oral   Take 5 mg by mouth 2 (two) times daily with a meal. Take in morning and at lunch         . conjugated estrogens (PREMARIN) vaginal cream   Vaginal   Place 1 Applicatorful vaginally daily. Apply 0.5mg  (pea-sized amount)  just inside the vaginal introitus with a finger-tip every night for two weeks and then Monday, Wednesday and Friday nights. Patient not taking: Reported on 11/01/2015   30 g   12   . estradiol (ESTRACE VAGINAL) 0.1 MG/GM vaginal cream      Apply 0.5mg  (pea-sized amount)  just inside the vaginal introitus with a finger-tip every night for two weeks and then Monday, Wednesday and Friday nights. Patient not taking: Reported on 11/12/2015   30 g   12     Allergies Amoxicillin; Chlorphen-phenyleph-asa; Erythromycin; Penicillins; and Sulfa antibiotics  Family History  Problem Relation Age of Onset  . Colon cancer Sister   . CAD Father   . Tuberculosis Sister     and grandson  . Cancer Brother   . Epilepsy Brother   . Leukemia Sister   . Parkinson's disease Sister   . Kidney disease Neg Hx   . Bladder Cancer Neg Hx     Social History Social History  Substance Use Topics  . Smoking status: Former Games developer  . Smokeless tobacco: None     Comment: quit 21 years  . Alcohol Use: No    Review of Systems Constitutional: No  fever/chills Eyes: No visual changes. ENT: No sore throat. Cardiovascular: Denies chest pain. Respiratory: Denies shortness of breath. Gastrointestinal: + abdominal pain.  + nausea, + vomiting.  No diarrhea.  No constipation. Genitourinary: Negative for dysuria. Musculoskeletal: Negative for back pain. Skin: Negative for  rash. Neurological: Negative for headaches, focal weakness or numbness.  10-point ROS otherwise negative.  ____________________________________________   PHYSICAL EXAM:  VITAL SIGNS: ED Triage Vitals  Enc Vitals Group     BP 11/16/2015 1831 120/53 mmHg     Pulse Rate 11/15/2015 1831 83     Resp 11/16/2015 1831 16     Temp 11/20/2015 1831 97.6 F (36.4 C)     Temp Source 12/05/2015 1831 Oral     SpO2 11/23/2015 1831 97 %     Weight 11/09/2015 1831 149 lb (67.586 kg)     Height 12/03/2015 1831 5\' 2"  (1.575 m)     Head Cir --      Peak Flow --      Pain Score 12/08/2015 1832 4     Pain Loc --      Pain Edu? --      Excl. in GC? --     Constitutional: Alert and oriented. Well appearing and in no acute distress. Eyes: Conjunctivae are normal. PERRL. EOMI. Head: Atraumatic. Nose: No congestion/rhinnorhea. Mouth/Throat: Mucous membranes are moist.  Oropharynx non-erythematous. Neck: No stridor.  Supple without meningismus. Cardiovascular: Normal rate, regular rhythm. Grossly normal heart sounds.  Good peripheral circulation. Respiratory: Normal respiratory effort.  No retractions. Lungs CTAB. Gastrointestinal: Soft with mild periumbilical tenderness. No distention.  No CVA tenderness. Genitourinary: deferred Musculoskeletal: No lower extremity tenderness nor edema.  No joint effusions. Neurologic:  Normal speech and language. No gross focal neurologic deficits are appreciated. Out of 5 strength in bilateral upper and lower extremity, sensation intact to light touch throughout. Skin:  Skin is warm, dry and intact. No rash noted. Psychiatric: Mood and affect are normal. Speech and behavior are normal.  ____________________________________________   LABS (all labs ordered are listed, but only abnormal results are displayed)  Labs Reviewed  BASIC METABOLIC PANEL - Abnormal; Notable for the following:    Sodium 127 (*)    Chloride 98 (*)    CO2 18 (*)    Glucose, Bld 150 (*)    BUN 28 (*)     Creatinine, Ser 1.35 (*)    Calcium 8.2 (*)    GFR calc non Af Amer 35 (*)    GFR calc Af Amer 40 (*)    All other components within normal limits  CBC - Abnormal; Notable for the following:    WBC 11.2 (*)    RBC 3.24 (*)    Hemoglobin 8.9 (*)    HCT 27.9 (*)    RDW 15.4 (*)    All other components within normal limits  LIPASE, BLOOD  URINALYSIS COMPLETEWITH MICROSCOPIC ([facility] ONLY)  TROPONIN I   ____________________________________________  EKG  ED ECG REPORT I, Gayla DossGayle, Philisha Weinel A, the attending physician, personally viewed and interpreted this ECG.   Date: 11/30/2015  EKG Time: 18:32  Rate: 83  Rhythm: normal sinus rhythm  Axis: normal  Intervals:nonspecific intraventricular conduction delay  ST&T Change: No acute ST elevation. Q waves in lead 3, V2, V3. Mild ST depression in V5 and V6 is chronic and unchanged from prior.  ____________________________________________  RADIOLOGY  CT abdomen and pelvis  IMPRESSION: 1. No evidence of bowel obstruction. 2. Small bilateral pleural effusions, mild periportal edema,  and mild body wall edema. 3. Hiatal hernia. ____________________________________________   PROCEDURES  Procedure(s) performed: None  Critical Care performed: No  ____________________________________________   INITIAL IMPRESSION / ASSESSMENT AND PLAN / ED COURSE  Pertinent labs & imaging results that were available during my care of the patient were reviewed by me and considered in my medical decision making (see chart for details).  Cheryl Wilson is a 82 y.o. female with history of dependence, diabetes, hyperlipidemia, GERD who presents for evaluation of abdominal pain, vomiting since yesterday, gradual onset, constant, currently mild to moderate. On exam, she is well-appearing and in no acute distress. Vital signs are stable, she is afebrile. She has mild epigastric/periumbilical tenderness. Plan for screening labs, CT of the abdomen and  pelvis and will treat her symptomatically.  ----------------------------------------- 12:23 AM on 11/30/2015 ----------------------------------------- Patient reports that she feels well and is requesting to go home. BMP is notable for mild creatinine elevation of 1.35 which appears close to her baseline. She has mild to moderate hyponatremia with sodium 127 and IV fluids were given. CBC with mild leukocytosis, anemia with hemoglobin 8.9 however hemoglobin was 9.9 just 2 months ago and this appears near her baseline. The lipase. CT scan shows no acute intra-abdominal pathology which would explain her abdominal discomfort. I discussed with her that her symptoms may be secondary to viral syndrome or use of magnesium citrate. Currently awaiting urinalysis and troponin. She reports that she feels better at this time and wants to go home. If she continues to appear well, her urinalysis and troponin are unremarkable and she is ambulatory, I think she could be discharged with close outpatient follow-up. Care to Dr. sung at this time. ____________________________________________   FINAL CLINICAL IMPRESSION(S) / ED DIAGNOSES  Final diagnoses:  Generalized abdominal pain  Non-intractable vomiting with nausea, vomiting of unspecified type      Gayla Doss, MD 11/30/15 [id number]

## 2015-11-29 NOTE — ED Notes (Signed)
Per family, pt has been constipated x2 weeks; has been taking OTC medications without relief. Pt took mag citrate and was vomiting yesterday. Family also reports pt has had several bowel movement. Pt has been eating and drinking, reports pt is increasingly weak. Family reports pt is normally able to walk by herself.

## 2015-11-30 ENCOUNTER — Encounter: Payer: Self-pay | Admitting: Family Medicine

## 2015-11-30 ENCOUNTER — Telehealth: Payer: Self-pay | Admitting: Cardiovascular Disease

## 2015-11-30 ENCOUNTER — Inpatient Hospital Stay (HOSPITAL_COMMUNITY)
Admit: 2015-11-30 | Discharge: 2015-11-30 | Disposition: A | Discharge status: Home / Self Care | Payer: Medicare Other | Attending: Cardiovascular Disease | Admitting: Cardiovascular Disease

## 2015-11-30 ENCOUNTER — Ambulatory Visit: Payer: Medicare Other | Admitting: Cardiovascular Disease

## 2015-11-30 DIAGNOSIS — Z880 Allergy status to penicillin: Secondary | ICD-10-CM | POA: Diagnosis not present

## 2015-11-30 DIAGNOSIS — E119 Type 2 diabetes mellitus without complications: Secondary | ICD-10-CM | POA: Insufficient documentation

## 2015-11-30 DIAGNOSIS — E785 Hyperlipidemia, unspecified: Secondary | ICD-10-CM | POA: Diagnosis present

## 2015-11-30 DIAGNOSIS — I4891 Unspecified atrial fibrillation: Secondary | ICD-10-CM | POA: Diagnosis present

## 2015-11-30 DIAGNOSIS — R1084 Generalized abdominal pain: Secondary | ICD-10-CM | POA: Diagnosis not present

## 2015-11-30 DIAGNOSIS — I459 Conduction disorder, unspecified: Secondary | ICD-10-CM | POA: Diagnosis present

## 2015-11-30 DIAGNOSIS — I700 Atherosclerosis of aorta: Secondary | ICD-10-CM | POA: Diagnosis present

## 2015-11-30 DIAGNOSIS — R7989 Other specified abnormal findings of blood chemistry: Secondary | ICD-10-CM | POA: Diagnosis not present

## 2015-11-30 DIAGNOSIS — N289 Disorder of kidney and ureter, unspecified: Secondary | ICD-10-CM | POA: Diagnosis present

## 2015-11-30 DIAGNOSIS — R600 Localized edema: Secondary | ICD-10-CM | POA: Diagnosis present

## 2015-11-30 DIAGNOSIS — G20 Parkinson's disease: Secondary | ICD-10-CM | POA: Diagnosis present

## 2015-11-30 DIAGNOSIS — I10 Essential (primary) hypertension: Secondary | ICD-10-CM | POA: Diagnosis present

## 2015-11-30 DIAGNOSIS — I2510 Atherosclerotic heart disease of native coronary artery without angina pectoris: Secondary | ICD-10-CM | POA: Insufficient documentation

## 2015-11-30 DIAGNOSIS — E871 Hypo-osmolality and hyponatremia: Secondary | ICD-10-CM | POA: Diagnosis present

## 2015-11-30 DIAGNOSIS — K5900 Constipation, unspecified: Secondary | ICD-10-CM | POA: Diagnosis present

## 2015-11-30 DIAGNOSIS — R778 Other specified abnormalities of plasma proteins: Secondary | ICD-10-CM | POA: Diagnosis present

## 2015-11-30 DIAGNOSIS — Z806 Family history of leukemia: Secondary | ICD-10-CM | POA: Diagnosis not present

## 2015-11-30 DIAGNOSIS — F0390 Unspecified dementia without behavioral disturbance: Secondary | ICD-10-CM | POA: Diagnosis present

## 2015-11-30 DIAGNOSIS — Z8249 Family history of ischemic heart disease and other diseases of the circulatory system: Secondary | ICD-10-CM | POA: Diagnosis not present

## 2015-11-30 DIAGNOSIS — F419 Anxiety disorder, unspecified: Secondary | ICD-10-CM | POA: Diagnosis present

## 2015-11-30 DIAGNOSIS — K219 Gastro-esophageal reflux disease without esophagitis: Secondary | ICD-10-CM | POA: Diagnosis present

## 2015-11-30 DIAGNOSIS — Z791 Long term (current) use of non-steroidal anti-inflammatories (NSAID): Secondary | ICD-10-CM | POA: Diagnosis not present

## 2015-11-30 DIAGNOSIS — R531 Weakness: Secondary | ICD-10-CM

## 2015-11-30 DIAGNOSIS — M1990 Unspecified osteoarthritis, unspecified site: Secondary | ICD-10-CM | POA: Diagnosis present

## 2015-11-30 DIAGNOSIS — F329 Major depressive disorder, single episode, unspecified: Secondary | ICD-10-CM | POA: Diagnosis present

## 2015-11-30 DIAGNOSIS — R079 Chest pain, unspecified: Secondary | ICD-10-CM | POA: Diagnosis not present

## 2015-11-30 DIAGNOSIS — I213 ST elevation (STEMI) myocardial infarction of unspecified site: Secondary | ICD-10-CM | POA: Diagnosis present

## 2015-11-30 DIAGNOSIS — E079 Disorder of thyroid, unspecified: Secondary | ICD-10-CM | POA: Diagnosis present

## 2015-11-30 DIAGNOSIS — R112 Nausea with vomiting, unspecified: Secondary | ICD-10-CM

## 2015-11-30 DIAGNOSIS — Z7982 Long term (current) use of aspirin: Secondary | ICD-10-CM | POA: Diagnosis not present

## 2015-11-30 DIAGNOSIS — R6884 Jaw pain: Secondary | ICD-10-CM | POA: Diagnosis present

## 2015-11-30 DIAGNOSIS — Z888 Allergy status to other drugs, medicaments and biological substances status: Secondary | ICD-10-CM | POA: Diagnosis not present

## 2015-11-30 DIAGNOSIS — Z79899 Other long term (current) drug therapy: Secondary | ICD-10-CM | POA: Diagnosis not present

## 2015-11-30 DIAGNOSIS — Z881 Allergy status to other antibiotic agents status: Secondary | ICD-10-CM | POA: Diagnosis not present

## 2015-11-30 DIAGNOSIS — B9620 Unspecified Escherichia coli [E. coli] as the cause of diseases classified elsewhere: Secondary | ICD-10-CM | POA: Diagnosis present

## 2015-11-30 DIAGNOSIS — N179 Acute kidney failure, unspecified: Secondary | ICD-10-CM | POA: Diagnosis present

## 2015-11-30 DIAGNOSIS — Z8673 Personal history of transient ischemic attack (TIA), and cerebral infarction without residual deficits: Secondary | ICD-10-CM | POA: Diagnosis not present

## 2015-11-30 DIAGNOSIS — E1151 Type 2 diabetes mellitus with diabetic peripheral angiopathy without gangrene: Secondary | ICD-10-CM | POA: Diagnosis present

## 2015-11-30 DIAGNOSIS — N390 Urinary tract infection, site not specified: Secondary | ICD-10-CM

## 2015-11-30 DIAGNOSIS — Z7902 Long term (current) use of antithrombotics/antiplatelets: Secondary | ICD-10-CM | POA: Diagnosis not present

## 2015-11-30 DIAGNOSIS — Z87440 Personal history of urinary (tract) infections: Secondary | ICD-10-CM | POA: Diagnosis not present

## 2015-11-30 DIAGNOSIS — Z79890 Hormone replacement therapy (postmenopausal): Secondary | ICD-10-CM | POA: Diagnosis not present

## 2015-11-30 DIAGNOSIS — Z87442 Personal history of urinary calculi: Secondary | ICD-10-CM | POA: Diagnosis not present

## 2015-11-30 DIAGNOSIS — Z800 Family history of malignant neoplasm of digestive organs: Secondary | ICD-10-CM | POA: Diagnosis not present

## 2015-11-30 DIAGNOSIS — Z87891 Personal history of nicotine dependence: Secondary | ICD-10-CM | POA: Diagnosis not present

## 2015-11-30 DIAGNOSIS — R1110 Vomiting, unspecified: Secondary | ICD-10-CM | POA: Insufficient documentation

## 2015-11-30 DIAGNOSIS — G8929 Other chronic pain: Secondary | ICD-10-CM | POA: Diagnosis present

## 2015-11-30 DIAGNOSIS — Z820 Family history of epilepsy and other diseases of the nervous system: Secondary | ICD-10-CM | POA: Diagnosis not present

## 2015-11-30 DIAGNOSIS — I739 Peripheral vascular disease, unspecified: Secondary | ICD-10-CM

## 2015-11-30 DIAGNOSIS — K5909 Other constipation: Secondary | ICD-10-CM | POA: Diagnosis present

## 2015-11-30 LAB — URINALYSIS COMPLETE WITH MICROSCOPIC (ARMC ONLY)
BILIRUBIN URINE: NEGATIVE
GLUCOSE, UA: NEGATIVE mg/dL
Nitrite: NEGATIVE
PH: 5 (ref 5.0–8.0)
Protein, ur: 100 mg/dL — AB
SPECIFIC GRAVITY, URINE: 1.025 (ref 1.005–1.030)

## 2015-11-30 LAB — TROPONIN I
Troponin I: 0.87 ng/mL — ABNORMAL HIGH (ref <0.031)
Troponin I: 0.71 ng/mL — ABNORMAL HIGH (ref <0.031)
Troponin I: 0.79 ng/mL — ABNORMAL HIGH (ref <0.031)

## 2015-11-30 LAB — BASIC METABOLIC PANEL
ANION GAP: 10 (ref 5–15)
BUN: 28 mg/dL — ABNORMAL HIGH (ref 6–20)
CHLORIDE: 99 mmol/L — AB (ref 101–111)
CO2: 19 mmol/L — AB (ref 22–32)
CREATININE: 1.15 mg/dL — AB (ref 0.44–1.00)
Calcium: 8.1 mg/dL — ABNORMAL LOW (ref 8.9–10.3)
GFR calc non Af Amer: 42 mL/min — ABNORMAL LOW (ref >60)
GFR, EST AFRICAN AMERICAN: 49 mL/min — AB (ref >60)
GLUCOSE: 119 mg/dL — AB (ref 65–99)
Potassium: 4.6 mmol/L (ref 3.5–5.1)
Sodium: 128 mmol/L — ABNORMAL LOW (ref 135–145)

## 2015-11-30 LAB — CBC
HCT: 28 % — ABNORMAL LOW (ref 35.0–47.0)
HEMOGLOBIN: 9.1 g/dL — AB (ref 12.0–16.0)
MCH: 28 pg (ref 26.0–34.0)
MCHC: 32.6 g/dL (ref 32.0–36.0)
MCV: 86 fL (ref 80.0–100.0)
Platelets: 289 10*3/uL (ref 150–440)
RBC: 3.26 MIL/uL — AB (ref 3.80–5.20)
RDW: 15.7 % — ABNORMAL HIGH (ref 11.5–14.5)
WBC: 15.2 10*3/uL — ABNORMAL HIGH (ref 3.6–11.0)

## 2015-11-30 MED ORDER — ACETAMINOPHEN 325 MG PO TABS
650.0000 mg | ORAL_TABLET | Freq: Four times a day (QID) | ORAL | Status: DC | PRN
  Filled 2015-11-30: qty 2

## 2015-11-30 MED ORDER — AMLODIPINE BESYLATE 5 MG PO TABS
5.0000 mg | ORAL_TABLET | Freq: Every day | ORAL | Status: DC
  Administered 2015-11-30: 5 mg via ORAL
  Filled 2015-11-30: qty 1

## 2015-11-30 MED ORDER — BISACODYL 10 MG RE SUPP
10.0000 mg | Freq: Once | RECTAL | Status: AC
  Administered 2015-11-30: 10 mg via RECTAL
  Filled 2015-11-30 (×2): qty 1

## 2015-11-30 MED ORDER — SODIUM CHLORIDE 0.9% FLUSH
3.0000 mL | Freq: Two times a day (BID) | INTRAVENOUS | Status: DC
  Administered 2015-11-30 (×2): 3 mL via INTRAVENOUS

## 2015-11-30 MED ORDER — BISACODYL 10 MG RE SUPP
10.0000 mg | Freq: Every day | RECTAL | Status: DC | PRN
  Filled 2015-11-30: qty 1

## 2015-11-30 MED ORDER — PNEUMOCOCCAL VAC POLYVALENT 25 MCG/0.5ML IJ INJ: 0.5000 mL | INJECTION | INTRAMUSCULAR | Status: DC

## 2015-11-30 MED ORDER — ROSUVASTATIN CALCIUM 10 MG PO TABS
10.0000 mg | ORAL_TABLET | Freq: Every day | ORAL | Status: DC
  Administered 2015-11-30: 10 mg via ORAL
  Filled 2015-11-30 (×2): qty 1

## 2015-11-30 MED ORDER — ESCITALOPRAM OXALATE 10 MG PO TABS
10.0000 mg | ORAL_TABLET | ORAL | Status: DC
  Administered 2015-11-30: 10 mg via ORAL
  Filled 2015-11-30: qty 1

## 2015-11-30 MED ORDER — HEPARIN SODIUM (PORCINE) 5000 UNIT/ML IJ SOLN
5000.0000 [IU] | Freq: Three times a day (TID) | INTRAMUSCULAR | Status: DC
  Administered 2015-11-30 (×2): 5000 [IU] via SUBCUTANEOUS
  Filled 2015-11-30 (×2): qty 1

## 2015-11-30 MED ORDER — ACETAMINOPHEN 650 MG RE SUPP: 650.0000 mg | Freq: Four times a day (QID) | RECTAL | Status: DC | PRN

## 2015-11-30 MED ORDER — DEXTROSE 5 % IV SOLN
1.0000 g | INTRAVENOUS | Status: DC
  Administered 2015-11-30: 1 g via INTRAVENOUS
  Filled 2015-11-30 (×2): qty 10

## 2015-11-30 MED ORDER — GABAPENTIN 100 MG PO CAPS
100.0000 mg | ORAL_CAPSULE | Freq: Three times a day (TID) | ORAL | Status: DC
  Administered 2015-11-30: 100 mg via ORAL
  Filled 2015-11-30: qty 1

## 2015-11-30 MED ORDER — ONDANSETRON HCL 4 MG PO TABS: 4.0000 mg | ORAL_TABLET | Freq: Four times a day (QID) | ORAL | Status: DC | PRN

## 2015-11-30 MED ORDER — BUSPIRONE HCL 5 MG PO TABS
5.0000 mg | ORAL_TABLET | Freq: Two times a day (BID) | ORAL | Status: DC
  Administered 2015-11-30: 5 mg via ORAL
  Filled 2015-11-30 (×2): qty 1

## 2015-11-30 MED ORDER — ALPRAZOLAM 0.5 MG PO TABS
0.2500 mg | ORAL_TABLET | Freq: Every evening | ORAL | Status: DC | PRN
  Administered 2015-11-30: 0.25 mg via ORAL
  Filled 2015-11-30: qty 1

## 2015-11-30 MED ORDER — SODIUM CHLORIDE 0.9 % IV SOLN
INTRAVENOUS | Status: DC
  Administered 2015-11-30: 06:00:00 via INTRAVENOUS

## 2015-11-30 MED ORDER — ALPRAZOLAM 0.25 MG PO TABS
0.5000 mg | ORAL_TABLET | Freq: Every day | ORAL | Status: DC
  Administered 2015-11-30: 0.5 mg via ORAL
  Filled 2015-11-30: qty 2

## 2015-11-30 MED ORDER — HYDROCODONE-ACETAMINOPHEN 5-325 MG PO TABS: 1.0000 | ORAL_TABLET | ORAL | Status: DC | PRN

## 2015-11-30 MED ORDER — ASPIRIN EC 81 MG PO TBEC
81.0000 mg | DELAYED_RELEASE_TABLET | Freq: Every day | ORAL | Status: DC
  Administered 2015-11-30: 81 mg via ORAL
  Filled 2015-11-30: qty 1

## 2015-11-30 MED ORDER — CARBIDOPA-LEVODOPA 25-100 MG PO TABS
1.0000 | ORAL_TABLET | Freq: Three times a day (TID) | ORAL | Status: DC
  Administered 2015-11-30 (×3): 1 via ORAL
  Filled 2015-11-30 (×5): qty 1

## 2015-11-30 MED ORDER — SELEGILINE HCL 5 MG PO TABS
5.0000 mg | ORAL_TABLET | Freq: Two times a day (BID) | ORAL | Status: DC
  Administered 2015-11-30 (×2): 5 mg via ORAL
  Filled 2015-11-30 (×5): qty 1

## 2015-11-30 MED ORDER — POLYETHYLENE GLYCOL 3350 17 G PO PACK
17.0000 g | PACK | Freq: Two times a day (BID) | ORAL | Status: DC
  Administered 2015-11-30: 17 g via ORAL
  Filled 2015-11-30: qty 1

## 2015-11-30 MED ORDER — DONEPEZIL HCL 5 MG PO TABS
5.0000 mg | ORAL_TABLET | Freq: Every day | ORAL | Status: DC
  Administered 2015-11-30: 5 mg via ORAL
  Filled 2015-11-30: qty 1

## 2015-11-30 MED ORDER — ALPRAZOLAM 0.25 MG PO TABS
0.2500 mg | ORAL_TABLET | Freq: Three times a day (TID) | ORAL | Status: DC | PRN
  Administered 2015-11-30 (×2): 0.25 mg via ORAL
  Filled 2015-11-30 (×2): qty 1

## 2015-11-30 MED ORDER — ONDANSETRON HCL 4 MG/2ML IJ SOLN
4.0000 mg | Freq: Four times a day (QID) | INTRAMUSCULAR | Status: DC | PRN
  Administered 2015-11-30 (×2): 4 mg via INTRAVENOUS
  Filled 2015-11-30 (×3): qty 2

## 2015-11-30 MED ORDER — PANTOPRAZOLE SODIUM 40 MG PO TBEC
40.0000 mg | DELAYED_RELEASE_TABLET | Freq: Every day | ORAL | Status: DC
Start: 2015-11-30 — End: 2015-12-01
  Administered 2015-11-30: 40 mg via ORAL
  Filled 2015-11-30: qty 1

## 2015-11-30 MED ORDER — CLOPIDOGREL BISULFATE 75 MG PO TABS
75.0000 mg | ORAL_TABLET | ORAL | Status: DC
  Administered 2015-11-30: 75 mg via ORAL
  Filled 2015-11-30: qty 1

## 2015-11-30 MED ORDER — LEVOTHYROXINE SODIUM 25 MCG PO TABS
25.0000 ug | ORAL_TABLET | Freq: Every day | ORAL | Status: DC
  Administered 2015-11-30: 25 ug via ORAL
  Filled 2015-11-30 (×3): qty 1

## 2015-11-30 MED ORDER — NITROFURANTOIN MONOHYD MACRO 100 MG PO CAPS: 100.0000 mg | ORAL_CAPSULE | Freq: Once | ORAL | Status: DC

## 2015-11-30 NOTE — Progress Notes (Addendum)
ED did not give lunch time dose of selegiline for parkinson's. Patient normally takes this at breakfast and lunch. Patient was not eating down in the emergency room. Patient is now no longer complaining of nausea and has eaten a biscuit, so she took her sinamet. Do not have selegiline on the floor, pharmacy is to send to give this evening. Family states she would take it now if she missed her lunch dose.  Paged Dr. Allena KatzPatel about home medications family questioned about. MD stated okay to order gabapentin order as well has PRN vicodin for her chronic foot pain. Patient has a prescription for vicodin at home. Patient also takes meloxicam, but MD stated not to order it as well as vicodin.

## 2015-11-30 NOTE — H&P (Signed)
[facility] -  at [facility]   PATIENT NAME: Cheryl HawsLillian Wilson    MR#:  [id number]  DATE OF BIRTH:  10/06/1927  DATE OF ADMISSION:  2016-05-15  PRIMARY CARE PHYSICIAN: Marguarite ArbourSPARKS,JEFFREY D, MD   REQUESTING/REFERRING PHYSICIAN: Dolores FrameSung, MD  CHIEF COMPLAINT:  No chief complaint on file.   HISTORY OF PRESENT ILLNESS:  Cheryl Wilson  is a 82 y.o. female who presents with Constipation. Patient states that she had gone several days without having a bowel movement and was having significant nausea. She took mag citrate at home and was able to have a couple of bowel movements. However, her nausea persisted. She came to the ED for evaluation for this. Here she was found to have likely UTI. She also was found to have an elevated troponin at 0.8. Upon further review, family at bedside states that she has had some symptoms recently including gum pain that radiated to her jaw and into her underarms. She also had a bout of chest pain last November, both of these raising some suspicion for true cardiac pathology. She was supposed to be seeing Dr. Mariah MillingGollan on in his clinic today for initial consultation. She was also found an evaluation in the ED to be hyponatremic with a sodium of 127. Due to this and also with her elevated troponin and UTI, hospitalists were called for admission and further evaluation.  PAST MEDICAL HISTORY:   Past Medical History  Diagnosis Date  . Dementia   . Diabetes mellitus without complication (HCC)   . Hyperlipemia   . Depression   . GERD (gastroesophageal reflux disease)   . Renal disorder   . CVA (cerebral infarction)   . Anxiety   . Parkinson disease (HCC)   . Thyroid disease   . Hypertension   . Arthritis   . Heart murmur   . TIA (transient ischemic attack)     x 5  . Kidney stone     PAST SURGICAL HISTORY:   Past Surgical History  Procedure Laterality Date  . Carotid endarterectomy    . Cholecystectomy    . Tonsillectomy and  adenoidectomy      SOCIAL HISTORY:   Social History  Substance Use Topics  . Smoking status: Former Games developermoker  . Smokeless tobacco: Not on file     Comment: quit 21 years  . Alcohol Use: No    FAMILY HISTORY:   Family History  Problem Relation Age of Onset  . Colon cancer Sister   . CAD Father   . Tuberculosis Sister     and grandson  . Cancer Brother   . Epilepsy Brother   . Leukemia Sister   . Parkinson's disease Sister   . Kidney disease Neg Hx   . Bladder Cancer Neg Hx     DRUG ALLERGIES:   Allergies  Allergen Reactions  . Amoxicillin Itching  . Chlorphen-Phenyleph-Asa Other (See Comments)    tachycardia  . Erythromycin Itching  . Penicillins Other (See Comments)    Reaction: Unknown  Has patient had a PCN reaction causing immediate rash, facial/tongue/throat swelling, SOB or lightheadedness with hypotension: NO Has patient had a PCN reaction causing severe rash involving mucus membranes or skin necrosis: NO Has patient had a PCN reaction that required hospitalization: NO Has patient had a PCN reaction occurring within the last 10 years: NO If all of the above answers are "NO", then may proceed with Cephalosporin use.  . Sulfa Antibiotics Itching    MEDICATIONS AT HOME:  Prior to Admission medications   Medication Sig Start Date End Date Taking? Authorizing Provider  ALPRAZolam Prudy Feeler) 0.25 MG tablet Take 0.25 mg by mouth at bedtime as needed for anxiety.   Yes Historical Provider, MD  amLODipine (NORVASC) 5 MG tablet Take 5 mg by mouth daily.  02/24/15 02/24/16 Yes Historical Provider, MD  aspirin EC 81 MG EC tablet Take 1 tablet (81 mg total) by mouth daily. 09/15/15  Yes Gale Journey, MD  bisacodyl (DULCOLAX) 5 MG EC tablet Take 1 tablet (5 mg total) by mouth daily as needed for moderate constipation. 09/15/15  Yes Gale Journey, MD  busPIRone (BUSPAR) 5 MG tablet Take 5 mg by mouth 2 (two) times daily. Reported on 10/18/2015   Yes Historical Provider, MD   carbidopa-levodopa (SINEMET IR) 25-100 MG tablet Take 1 tablet by mouth 3 (three) times daily.   Yes Historical Provider, MD  clopidogrel (PLAVIX) 75 MG tablet Take 75 mg by mouth every morning.   Yes Historical Provider, MD  colestipol (COLESTID) 1 g tablet Take 2 g by mouth 2 (two) times daily.   Yes Historical Provider, MD  docusate sodium (COLACE) 100 MG capsule Take 1 capsule (100 mg total) by mouth 2 (two) times daily. 09/15/15  Yes Gale Journey, MD  donepezil (ARICEPT) 5 MG tablet Take 5 mg by mouth at bedtime.   Yes Historical Provider, MD  escitalopram (LEXAPRO) 10 MG tablet Take 10 mg by mouth every morning.   Yes Historical Provider, MD  ferrous sulfate 325 (65 FE) MG tablet Take 325 mg by mouth daily with breakfast.    Yes Historical Provider, MD  gabapentin (NEURONTIN) 100 MG capsule Take 100 mg by mouth 3 (three) times daily.   Yes Historical Provider, MD  iron polysaccharides (NIFEREX) 150 MG capsule Take 150 mg by mouth 2 (two) times daily.   Yes Historical Provider, MD  L-Methylfolate-Algae-B12-B6 Hebert Soho RF) 3-90.314-2-35 MG CAPS Take 1 capsule by mouth every morning. Reported on 11/01/2015   Yes Historical Provider, MD  lansoprazole (PREVACID) 15 MG capsule Take 15 mg by mouth every morning.   Yes Historical Provider, MD  levothyroxine (SYNTHROID, LEVOTHROID) 25 MCG tablet Take 25 mcg by mouth daily before breakfast.   Yes Historical Provider, MD  meloxicam (MOBIC) 7.5 MG tablet Take 7.5 mg by mouth every evening.   Yes Historical Provider, MD  metoCLOPramide (REGLAN) 5 MG tablet Take 5 mg by mouth 4 (four) times daily.   Yes Historical Provider, MD  mirabegron ER (MYRBETRIQ) 25 MG TB24 tablet Take 25 mg by mouth every morning.   Yes Historical Provider, MD  primidone (MYSOLINE) 50 MG tablet Take 50 mg by mouth 2 (two) times daily. Reported on 11/01/2015   Yes Historical Provider, MD  rosuvastatin (CRESTOR) 10 MG tablet Take 10 mg by mouth at bedtime.   Yes Historical Provider,  MD  selegiline (ELDEPRYL) 5 MG tablet Take 5 mg by mouth 2 (two) times daily with a meal. Take in morning and at lunch   Yes Historical Provider, MD  conjugated estrogens (PREMARIN) vaginal cream Place 1 Applicatorful vaginally daily. Apply 0.5mg  (pea-sized amount)  just inside the vaginal introitus with a finger-tip every night for two weeks and then Monday, Wednesday and Friday nights. Patient not taking: Reported on 11/01/2015 10/18/15   Harle Battiest, PA-C  estradiol (ESTRACE VAGINAL) 0.1 MG/GM vaginal cream Apply 0.5mg  (pea-sized amount)  just inside the vaginal introitus with a finger-tip every night for two weeks and then  Monday, Wednesday and Friday nights. Patient not taking: Reported on 11/21/2015 10/18/15   Harle Battiest, PA-C    REVIEW OF SYSTEMS:  Review of Systems  Constitutional: Negative for fever, chills, weight loss and malaise/fatigue.  HENT: Negative for ear pain, hearing loss and tinnitus.   Eyes: Negative for blurred vision, double vision, pain and redness.  Respiratory: Negative for cough, hemoptysis and shortness of breath.   Cardiovascular: Negative for chest pain, palpitations, orthopnea and leg swelling.  Gastrointestinal: Positive for nausea and constipation. Negative for vomiting, abdominal pain and diarrhea.  Genitourinary: Negative for dysuria, frequency and hematuria.  Musculoskeletal: Negative for back pain, joint pain and neck pain.  Skin:       No acne, rash, or lesions  Neurological: Negative for dizziness, tremors, focal weakness and weakness.  Endo/Heme/Allergies: Negative for polydipsia. Does not bruise/bleed easily.  Psychiatric/Behavioral: Negative for depression. The patient is not nervous/anxious and does not have insomnia.      VITAL SIGNS:   Filed Vitals:   11/15/2015 1831 11/30/15 0143  BP: 120/53 114/55  Pulse: 83 76  Temp: 97.6 F (36.4 C)   TempSrc: Oral   Resp: 16 16  Height:  (1.575 m)   Weight: 67.586 kg (149 lb)   SpO2:  97% 100%   Wt Readings from Last 3 Encounters:  11/13/2015 67.586 kg (149 lb)  11/01/15 67.813 kg (149 lb 8 oz)  10/18/15 67.677 kg (149 lb 3.2 oz)    PHYSICAL EXAMINATION:  Physical Exam  Vitals reviewed. Constitutional: She is oriented to person, place, and time. She appears well-developed and well-nourished. No distress.  HENT:  Head: Normocephalic and atraumatic.  Mouth/Throat: Oropharynx is clear and moist.  Eyes: Conjunctivae and EOM are normal. Pupils are equal, round, and reactive to light. No scleral icterus.  Neck: Normal range of motion. Neck supple. No JVD present. No thyromegaly present.  Cardiovascular: Normal rate, regular rhythm and intact distal pulses.  Exam reveals no gallop and no friction rub.   No murmur heard. Respiratory: Effort normal and breath sounds normal. No respiratory distress. She has no wheezes. She has no rales.  GI: Soft. Bowel sounds are normal. She exhibits no distension. There is no tenderness.  Musculoskeletal: Normal range of motion. She exhibits no edema.  No arthritis, no gout  Lymphadenopathy:    She has no cervical adenopathy.  Neurological: She is alert and oriented to person, place, and time. No cranial nerve deficit.  No dysarthria, no aphasia  Skin: Skin is warm and dry. No rash noted. No erythema.  Psychiatric: She has a normal mood and affect. Her behavior is normal. Judgment and thought content normal.    LABORATORY PANEL:   CBC  Recent Labs Lab 11/26/2015 1835  WBC 11.2*  HGB 8.9*  HCT 27.9*  PLT 270   ------------------------------------------------------------------------------------------------------------------  Chemistries   Recent Labs Lab 11/28/2015 1835  NA 127*  K 4.5  CL 98*  CO2 18*  GLUCOSE 150*  BUN 28*  CREATININE 1.35*  CALCIUM 8.2*   ------------------------------------------------------------------------------------------------------------------  Cardiac Enzymes  Recent Labs Lab  11/30/15 0026  TROPONINI 0.87*   ------------------------------------------------------------------------------------------------------------------  RADIOLOGY:  Ct Abdomen Pelvis W Contrast  11/24/2015  CLINICAL DATA:  Abdominal pain and vomiting. Constipated for 2 weeks. Worsening weakness. EXAM: CT ABDOMEN AND PELVIS WITH CONTRAST TECHNIQUE: Multidetector CT imaging of the abdomen and pelvis was performed using the standard protocol following bolus administration of intravenous contrast. CONTRAST:  75mL OMNIPAQUE IOHEXOL 300 MG/ML  SOLN COMPARISON:  05/07/2006 FINDINGS: Small bilateral pleural effusions are partially visualized in the lung bases. There is bilateral lower lobe atelectasis. Coronary artery calcifications are noted. The gallbladder is surgically absent. No significant biliary dilatation is identified. Mild periportal edema is present. A small calcification is again seen in the right hepatic lobe. The spleen, adrenal glands, and pancreas are unremarkable. A 2.8 cm right lower pole renal cyst has enlarged. Vascular calcifications are present in both renal hila. There is no hydronephrosis. A small to moderate-sized sliding hiatal hernia is again seen. There may be a small duodenum diverticulum. Oral contrast is present in the stomach and in nondilated proximal small bowel. There is no evidence of bowel obstruction. The appendix is not definitively identified, however no inflammatory changes are seen in the right lower quadrant. There is diverticulosis of the descending and sigmoid colon without evidence of acute diverticulitis. The bladder is unremarkable. The uterus is unremarkable. No pelvic mass is seen. Extensive, diffuse atherosclerotic calcification is noted of the abdominal aorta and its major branch vessels. No free fluid or enlarged lymph nodes are identified. Advanced multilevel lumbar disc degeneration is noted. There is slight diffuse body wall edema. IMPRESSION: 1. No evidence of  bowel obstruction. 2. Small bilateral pleural effusions, mild periportal edema, and mild body wall edema. 3. Hiatal hernia. Electronically Signed   By: Sebastian Ache M.D.   On: 11/22/2015 23:24    EKG:   Orders placed or performed during the hospital encounter of 12/05/2015  . EKG 12-Lead  . EKG 12-Lead  . ED EKG  . ED EKG    IMPRESSION AND PLAN:  Principal Problem:   UTI (lower urinary tract infection) - IV Rocephin started in the ED. Urine culture sent. We'll follow up culture and tailor antibiotics as appropriate. Active Problems:   Hyponatremia - possibly due to poor by mouth intake due to nausea, also possibly due to mag citrate usage. We will initially treat with gentle normal saline administration.   Elevated troponin - truly elevated. Though unclear if this is true cardiac ischemia at this time. If her subsequent troponin is further elevated we will start anticoagulation. We have ordered echocardiogram and cardiology consult.   Constipation - when necessary meds ordered, patient's last bowel movement was today.   HTN (hypertension) - continue home meds   GERD (gastroesophageal reflux disease) - home dose PPI  All the records are reviewed and case discussed with ED provider. Management plans discussed with the patient and/or family.  DVT PROPHYLAXIS: SubQ heparin  GI PROPHYLAXIS: PPI  ADMISSION STATUS: Inpatient  CODE STATUS: Full Code Status History    Date Active Date Inactive Code Status Order ID Comments User Context   09/13/2015  1:49 AM 09/15/2015  7:01 PM Full Code [id number]  Milagros Loll, MD ED    Advance Directive Documentation        Most Recent Value   Type of Advance Directive  Healthcare Power of Attorney   Pre-existing out of facility DNR order (yellow form or pink MOST form)     "MOST" Form in Place?        TOTAL TIME TAKING CARE OF THIS PATIENT: 45 minutes.    Essa Wenk FIELDING 11/30/2015, 2:24 AM  [facility]  Office   [phone]  CC: Primary care physician; Marguarite Arbour, MD

## 2015-11-30 NOTE — ED Notes (Signed)
ED RN called lab to ask about troponin results. Lab reports the results should be ready close to 1:15.

## 2015-11-30 NOTE — Progress Notes (Signed)
Patient ID: Cheryl NicksLillian Rebecca Wilson, female   DOB: 1928/01/14, 82 y.o.   MRN: [id number] [facility]  [facility]   PATIENT NAME: Cheryl Wilson    MR#:  [id number]  DATE OF BIRTH:  1928/01/14  SUBJECTIVE:  Seen in the ER. Poor historian.  Nausea due to constipation.  Found to have UTI REVIEW OF SYSTEMS:   Review of Systems  Constitutional: Negative for fever, chills and weight loss.  HENT: Negative for ear discharge, ear pain and nosebleeds.   Eyes: Negative for blurred vision, pain and discharge.  Respiratory: Negative for sputum production, shortness of breath, wheezing and stridor.   Cardiovascular: Negative for chest pain, palpitations, orthopnea and PND.  Gastrointestinal: Positive for nausea and constipation. Negative for vomiting, abdominal pain and diarrhea.  Genitourinary: Negative for urgency and frequency.  Musculoskeletal: Negative for back pain and joint pain.  Neurological: Negative for sensory change, speech change, focal weakness and weakness.  Psychiatric/Behavioral: Negative for depression and hallucinations. The patient is not nervous/anxious.   All other systems reviewed and are negative.  Tolerating Diet:yes Tolerating PT: Pending  DRUG ALLERGIES:   Allergies  Allergen Reactions  . Amoxicillin Itching  . Chlorphen-Phenyleph-Asa Other (See Comments)    tachycardia  . Erythromycin Itching  . Penicillins Other (See Comments)    Reaction: Unknown  Has patient had a PCN reaction causing immediate rash, facial/tongue/throat swelling, SOB or lightheadedness with hypotension: NO Has patient had a PCN reaction causing severe rash involving mucus membranes or skin necrosis: NO Has patient had a PCN reaction that required hospitalization: NO Has patient had a PCN reaction occurring within the last 10 years: NO If all of the above answers are "NO", then may proceed with Cephalosporin use.  . Sulfa Antibiotics Itching     VITALS:  Blood pressure 116/52, pulse 87, temperature 97.6 F (36.4 C), temperature source Oral, resp. rate 21, height 5\' 2"  (1.575 m), weight 67.586 kg (149 lb), SpO2 99 %.  PHYSICAL EXAMINATION:   Physical Exam  GENERAL:  82 y.o.-year-old patient lying in the bed with no acute distress. Pallor+ EYES: Pupils equal, round, reactive to light and accommodation. No scleral icterus. Extraocular muscles intact.  HEENT: Head atraumatic, normocephalic. Oropharynx and nasopharynx clear.  NECK:  Supple, no jugular venous distention. No thyroid enlargement, no tenderness.  LUNGS: Normal breath sounds bilaterally, no wheezing, rales, rhonchi. No use of accessory muscles of respiration.  CARDIOVASCULAR: S1, S2 normal. No murmurs, rubs, or gallops. Intermittent dropped beat ABDOMEN: Soft, nontender, nondistended. Bowel sounds present. No organomegaly or mass.  EXTREMITIES: No cyanosis, clubbing or edema b/l.    NEUROLOGIC: Cranial nerves II through XII are intact. No focal Motor or sensory deficits b/l.  weak PSYCHIATRIC:  patient is alert and oriented x 2  SKIN: No obvious rash, lesion, or ulcer.   LABORATORY PANEL:  CBC  Recent Labs Lab 11/30/15 0627  WBC 15.2*  HGB 9.1*  HCT 28.0*  PLT 289    Chemistries   Recent Labs Lab 11/30/15 0627  NA 128*  K 4.6  CL 99*  CO2 19*  GLUCOSE 119*  BUN 28*  CREATININE 1.15*  CALCIUM 8.1*   Cardiac Enzymes  Recent Labs Lab 11/30/15 1337  TROPONINI 0.79*   RADIOLOGY:  Ct Abdomen Pelvis W Contrast  March 07, 2016  CLINICAL DATA:  Abdominal pain and vomiting. Constipated for 2 weeks. Worsening weakness. EXAM: CT ABDOMEN AND PELVIS WITH CONTRAST TECHNIQUE: Multidetector CT imaging of the abdomen and pelvis was performed  using the standard protocol following bolus administration of intravenous contrast. CONTRAST:  75mL OMNIPAQUE IOHEXOL 300 MG/ML  SOLN COMPARISON:  05/07/2006 FINDINGS: Small bilateral pleural effusions are partially  visualized in the lung bases. There is bilateral lower lobe atelectasis. Coronary artery calcifications are noted. The gallbladder is surgically absent. No significant biliary dilatation is identified. Mild periportal edema is present. A small calcification is again seen in the right hepatic lobe. The spleen, adrenal glands, and pancreas are unremarkable. A 2.8 cm right lower pole renal cyst has enlarged. Vascular calcifications are present in both renal hila. There is no hydronephrosis. A small to moderate-sized sliding hiatal hernia is again seen. There may be a small duodenum diverticulum. Oral contrast is present in the stomach and in nondilated proximal small bowel. There is no evidence of bowel obstruction. The appendix is not definitively identified, however no inflammatory changes are seen in the right lower quadrant. There is diverticulosis of the descending and sigmoid colon without evidence of acute diverticulitis. The bladder is unremarkable. The uterus is unremarkable. No pelvic mass is seen. Extensive, diffuse atherosclerotic calcification is noted of the abdominal aorta and its major branch vessels. No free fluid or enlarged lymph nodes are identified. Advanced multilevel lumbar disc degeneration is noted. There is slight diffuse body wall edema. IMPRESSION: 1. No evidence of bowel obstruction. 2. Small bilateral pleural effusions, mild periportal edema, and mild body wall edema. 3. Hiatal hernia. Electronically Signed   By: Sebastian Ache M.D.   On: 11/23/2015 23:24   ASSESSMENT AND PLAN:   Cheryl Wilson is a 82 y.o. female who presents with Constipation. Patient states that she had gone several days without having a bowel movement and was having significant nausea. She took mag citrate at home and was able to have a couple of bowel movements.   1.UTI (lower urinary tract infection) - IV Rocephin started in the ED. Urine culture sent. We'll follow up culture and tailor antibiotics as  appropriate.  2 Hyponatremia - possibly due to poor by mouth intake due to nausea, also possibly due to mag citrate usage. We will initially treat with gentle normal saline administration.  3. Elevated troponin -unclear if this is true cardiac ischemia at this time. - pending chocardiogram and cardiology consult. -no cp  4. Constipation - when necessary meds ordered, patient's last bowel movement was today. -miralax, senna  5. HTN (hypertension) - continue home meds   6.GERD (gastroesophageal reflux disease) - home dose PPI  Case discussed with Care Management/Social Worker. Management plans discussed with the patient, family and they are in agreement.  CODE STATUS: full  DVT Prophylaxis: lovenox  TOTAL TIME TAKING CARE OF THIS PATIENT: 30 minutes.  >50% time spent on counselling and coordination of care with family  POSSIBLE D/C IN 1-2 DAYS, DEPENDING ON CLINICAL CONDITION.  Note: This dictation was prepared with Dragon dictation along with smaller phrase technology. Any transcriptional errors that result from this process are unintentional.  Twala Collings M.D on 11/30/2015 at 3:31 PM  Between 7am to 6pm - Pager - [phone]  After 6pm go to [email or web address] - password [email or web address] [facility]  [facility]  Office  [phone]  CC: Primary care physician; Marguarite Arbour, MD

## 2015-11-30 NOTE — Telephone Encounter (Signed)
Consult received.  Forwarded to Albertson'syan Dunn, PA.

## 2015-11-30 NOTE — ED Notes (Signed)
Report from Sara, RN

## 2015-11-30 NOTE — ED Notes (Addendum)
Pt requesting to eat, explained to patient that she has NPO diet ordered by hospitalist.  Dr Allena KatzPatel paged and responded to page, states that pt can eat. Order placed to dietary and called. Family informed.

## 2015-11-30 NOTE — ED Notes (Signed)
Pt was placed on bedpan for 20 minutes and reports being unable to urinate. Pt requested an in and out cathetor. MD verbalized consent. Urine sample obtained through in and out.

## 2015-11-30 NOTE — Consult Note (Signed)
Cardiology Consultation Note  Patient ID: Cheryl Wilson, MRN: [id number], DOB/AGE: Sep 13, 1927 82 y.o. Admit date: 12-17-15   Date of Consult: 11/30/2015 Primary Physician: Marguarite Arbour, MD Primary Cardiologist: New to [facility]  Chief Complaint: Fatigue Reason for Consult: Elevated troponin  HPI: 82 y.o. female with h/o TIA x 5, carotid artery disease, dementia, Parkinson's disease, frequent UTI's, longstanding gum pain for years, severe anxiety, HTN, and HLD who presented to [facility] with increased fatigue and was found to have a UTI. Cardiology is consulted for mildly elevated and flat trending troponin.   No previously known cardiac history. She was scheduled to see Dr. Mariah Milling, MD today for evaluation of her increased fatigue and prior ED visits back in the fall for chest pain in which she ruled out and was advised to see a cardiologist. She has a long history of gum pain which is relieved by Orajel and worsened by the cold air. She has been scheduled to see an oral surgeon for this.She reports at times this radiates to her shoulders. No associated chest pain. She develops frequent UTI's in the setting of chronic constipation requiring the need for medications at times. Most recently she had gone several days without having a bowel movement and took some magnesium citrate. This was accompanied by nausea and increased fatigue. Her family brought her in for evaluation. Interestingly, the patient lives by herself. The patient has never told her family she had chest pain or SOB.     Upon the patient's arrival to [facility] they were found to have a mildly elevated troponin that was flat trending at 0.87-->0.71-->0.79. UA showed UTI, CBC showed leukocytosis of 15,000. She has been on Rocephin. ECG showed NSR, 83 bpm, nonspecific intraventricular conduction delay, inferolateral TWI, cannot rule anterior ischemia, CT abdomen showed no evidence of bowel obstruction with small bilateral pleural effusions,  mild periportal edema, and mild body wall edema.    Past Medical History  Diagnosis Date  . Dementia   . Diabetes mellitus without complication (HCC)   . Hyperlipemia   . Depression   . GERD (gastroesophageal reflux disease)   . Renal disorder   . CVA (cerebral infarction)   . Anxiety   . Parkinson disease (HCC)   . Thyroid disease   . Hypertension   . Arthritis   . Heart murmur   . TIA (transient ischemic attack)     x 5  . Kidney stone       Most Recent Cardiac Studies: None.   Surgical History:  Past Surgical History  Procedure Laterality Date  . Carotid endarterectomy    . Cholecystectomy    . Tonsillectomy and adenoidectomy       Home Meds: Prior to Admission medications   Medication Sig Start Date End Date Taking? Authorizing Provider  ALPRAZolam Prudy Feeler) 0.25 MG tablet Take 0.25 mg by mouth at bedtime as needed for anxiety.   Yes Historical Provider, MD  amLODipine (NORVASC) 5 MG tablet Take 5 mg by mouth daily.  02/24/15 02/24/16 Yes Historical Provider, MD  aspirin EC 81 MG EC tablet Take 1 tablet (81 mg total) by mouth daily. 09/15/15  Yes Gale Journey, MD  bisacodyl (DULCOLAX) 5 MG EC tablet Take 1 tablet (5 mg total) by mouth daily as needed for moderate constipation. 09/15/15  Yes Gale Journey, MD  busPIRone (BUSPAR) 5 MG tablet Take 5 mg by mouth 2 (two) times daily. Reported on 10/18/2015   Yes Historical Provider, MD  carbidopa-levodopa (SINEMET IR)  25-100 MG tablet Take 1 tablet by mouth 3 (three) times daily.   Yes Historical Provider, MD  clopidogrel (PLAVIX) 75 MG tablet Take 75 mg by mouth every morning.   Yes Historical Provider, MD  colestipol (COLESTID) 1 g tablet Take 2 g by mouth 2 (two) times daily.   Yes Historical Provider, MD  docusate sodium (COLACE) 100 MG capsule Take 1 capsule (100 mg total) by mouth 2 (two) times daily. 09/15/15  Yes Gale Journeyatherine P Walsh, MD  donepezil (ARICEPT) 5 MG tablet Take 5 mg by mouth at bedtime.   Yes Historical  Provider, MD  escitalopram (LEXAPRO) 10 MG tablet Take 10 mg by mouth every morning.   Yes Historical Provider, MD  ferrous sulfate 325 (65 FE) MG tablet Take 325 mg by mouth daily with breakfast.    Yes Historical Provider, MD  gabapentin (NEURONTIN) 100 MG capsule Take 100 mg by mouth 3 (three) times daily.   Yes Historical Provider, MD  iron polysaccharides (NIFEREX) 150 MG capsule Take 150 mg by mouth 2 (two) times daily.   Yes Historical Provider, MD  L-Methylfolate-Algae-B12-B6 [place](FOLTANX RF) 3-90.314-2-35 MG CAPS Take 1 capsule by mouth every morning. Reported on 11/01/2015   Yes Historical Provider, MD  lansoprazole (PREVACID) 15 MG capsule Take 15 mg by mouth every morning.   Yes Historical Provider, MD  levothyroxine (SYNTHROID, LEVOTHROID) 25 MCG tablet Take 25 mcg by mouth daily before breakfast.   Yes Historical Provider, MD  meloxicam (MOBIC) 7.5 MG tablet Take 7.5 mg by mouth every evening.   Yes Historical Provider, MD  metoCLOPramide (REGLAN) 5 MG tablet Take 5 mg by mouth 4 (four) times daily.   Yes Historical Provider, MD  mirabegron ER (MYRBETRIQ) 25 MG TB24 tablet Take 25 mg by mouth every morning.   Yes Historical Provider, MD  primidone (MYSOLINE) 50 MG tablet Take 50 mg by mouth 2 (two) times daily. Reported on 11/01/2015   Yes Historical Provider, MD  rosuvastatin (CRESTOR) 10 MG tablet Take 10 mg by mouth at bedtime.   Yes Historical Provider, MD  selegiline (ELDEPRYL) 5 MG tablet Take 5 mg by mouth 2 (two) times daily with a meal. Take in morning and at lunch   Yes Historical Provider, MD  conjugated estrogens (PREMARIN) vaginal cream Place 1 Applicatorful vaginally daily. Apply 0.5mg  (pea-sized amount)  just inside the vaginal introitus with a finger-tip every night for two weeks and then Monday, Wednesday and Friday nights. Patient not taking: Reported on 11/01/2015 10/18/15   Harle BattiestShannon A McGowan, PA-C  estradiol (ESTRACE VAGINAL) 0.1 MG/GM vaginal cream Apply 0.5mg  (pea-sized  amount)  just inside the vaginal introitus with a finger-tip every night for two weeks and then Monday, Wednesday and Friday nights. Patient not taking: Reported on 12/03/2015 10/18/15   Harle BattiestShannon A McGowan, PA-C    Inpatient Medications:  . ALPRAZolam  0.5 mg Oral QHS  . amLODipine  5 mg Oral Daily  . aspirin EC  81 mg Oral Daily  . bisacodyl  10 mg Rectal Once  . busPIRone  5 mg Oral BID  . carbidopa-levodopa  1 tablet Oral TID  . cefTRIAXone (ROCEPHIN)  IV  1 g Intravenous Q24H  . clopidogrel  75 mg Oral BH-q7a  . donepezil  5 mg Oral QHS  . escitalopram  10 mg Oral BH-q7a  . heparin  5,000 Units Subcutaneous 3 times per day  . levothyroxine  25 mcg Oral QAC breakfast  . pantoprazole  40 mg Oral Daily  . [  START ON 12/08/2015] pneumococcal 23 valent vaccine  0.5 mL Intramuscular Tomorrow-1000  . polyethylene glycol  17 g Oral BID  . rosuvastatin  10 mg Oral QHS  . selegiline  5 mg Oral BID WC  . sodium chloride flush  3 mL Intravenous Q12H      Allergies:  Allergies  Allergen Reactions  . Amoxicillin Itching  . Chlorphen-Phenyleph-Asa Other (See Comments)    tachycardia  . Erythromycin Itching  . Penicillins Other (See Comments)    Reaction: Unknown  Has patient had a PCN reaction causing immediate rash, facial/tongue/throat swelling, SOB or lightheadedness with hypotension: NO Has patient had a PCN reaction causing severe rash involving mucus membranes or skin necrosis: NO Has patient had a PCN reaction that required hospitalization: NO Has patient had a PCN reaction occurring within the last 10 years: NO If all of the above answers are "NO", then may proceed with Cephalosporin use.  . Sulfa Antibiotics Itching    Social History   Social History  . Marital Status: Widowed    Spouse Name: N/A  . Number of Children: N/A  . Years of Education: N/A   Occupational History  . Not on file.   Social History Main Topics  . Smoking status: Former Games developer  . Smokeless  tobacco: Not on file     Comment: quit 21 years  . Alcohol Use: No  . Drug Use: No  . Sexual Activity: No   Other Topics Concern  . Not on file   Social History Narrative     Family History  Problem Relation Age of Onset  . Colon cancer Sister   . CAD Father   . Tuberculosis Sister     and grandson  . Cancer Brother   . Epilepsy Brother   . Leukemia Sister   . Parkinson's disease Sister   . Kidney disease Neg Hx   . Bladder Cancer Neg Hx      Review of Systems: Review of Systems  Unable to perform ROS: dementia     Labs:  Recent Labs  11/30/15 0026 11/30/15 0627 11/30/15 1337  TROPONINI 0.87* 0.71* 0.79*   Lab Results  Component Value Date   WBC 15.2* 11/30/2015   HGB 9.1* 11/30/2015   HCT 28.0* 11/30/2015   MCV 86.0 11/30/2015   PLT 289 11/30/2015    Recent Labs Lab 11/30/15 0627  NA 128*  K 4.6  CL 99*  CO2 19*  BUN 28*  CREATININE 1.15*  CALCIUM 8.1*  GLUCOSE 119*   No results found for: CHOL, HDL, LDLCALC, TRIG No results found for: DDIMER  Radiology/Studies:  Ct Abdomen Pelvis W Contrast  2015/12/13  CLINICAL DATA:  Abdominal pain and vomiting. Constipated for 2 weeks. Worsening weakness. EXAM: CT ABDOMEN AND PELVIS WITH CONTRAST TECHNIQUE: Multidetector CT imaging of the abdomen and pelvis was performed using the standard protocol following bolus administration of intravenous contrast. CONTRAST:  75mL OMNIPAQUE IOHEXOL 300 MG/ML  SOLN COMPARISON:  05/07/2006 FINDINGS: Small bilateral pleural effusions are partially visualized in the lung bases. There is bilateral lower lobe atelectasis. Coronary artery calcifications are noted. The gallbladder is surgically absent. No significant biliary dilatation is identified. Mild periportal edema is present. A small calcification is again seen in the right hepatic lobe. The spleen, adrenal glands, and pancreas are unremarkable. A 2.8 cm right lower pole renal cyst has enlarged. Vascular calcifications are  present in both renal hila. There is no hydronephrosis. A small to moderate-sized sliding hiatal hernia is  again seen. There may be a small duodenum diverticulum. Oral contrast is present in the stomach and in nondilated proximal small bowel. There is no evidence of bowel obstruction. The appendix is not definitively identified, however no inflammatory changes are seen in the right lower quadrant. There is diverticulosis of the descending and sigmoid colon without evidence of acute diverticulitis. The bladder is unremarkable. The uterus is unremarkable. No pelvic mass is seen. Extensive, diffuse atherosclerotic calcification is noted of the abdominal aorta and its major branch vessels. No free fluid or enlarged lymph nodes are identified. Advanced multilevel lumbar disc degeneration is noted. There is slight diffuse body wall edema. IMPRESSION: 1. No evidence of bowel obstruction. 2. Small bilateral pleural effusions, mild periportal edema, and mild body wall edema. 3. Hiatal hernia. Electronically Signed   By: Sebastian Ache M.D.   On: 2015-12-16 23:24    EKG: NSR, 83 bpm, nonspecific intraventricular conduction delay, inferolateral TWI, cannot rule anterior ischemia, baseline wandering  Weights: Filed Weights   2015-12-16 1831  Weight: 149 lb (67.586 kg)     Physical Exam: Blood pressure 115/65, pulse 81, temperature 97.6 F (36.4 C), temperature source Oral, resp. rate 18, height  (1.575 m), weight 149 lb (67.586 kg), SpO2 100 %. Body mass index is 27.25 kg/(m^2). General: Well developed, well nourished, in no acute distress. Head: Normocephalic, atraumatic, sclera non-icteric, no xanthomas, nares are without discharge.  Neck: Negative for carotid bruits. JVD not elevated. Lungs: Clear bilaterally to auscultation without wheezes, rales, or rhonchi. Breathing is unlabored. Heart: RRR with S1 S2. II/VI systolic murmurs, no rubs, or gallops appreciated. Abdomen: Soft, non-tender, non-distended  with normoactive bowel sounds. No hepatomegaly. No rebound/guarding. No obvious abdominal masses. Msk:  Strength and tone appear normal for age. Extremities: No clubbing or cyanosis. No edema.  Distal pedal pulses are 2+ and equal bilaterally. Neuro: Sleepy. No facial asymmetry. No focal deficit. Moves all extremities spontaneously. Psych:  Demented.    Assessment and Plan:   1. Elevated troponin: -Mildly elevated and flat trending likely supply demand ischemia in the setting of her sepsis secondary to her UTI -Never with any anginal symptoms -Check echo to evaluate LV systolic function and wall motion -No indication for heparin gtt at this time -Consider outpatient nuclear stress testing if indicated, though given her dementia this may not be indicated  2. Sepsis in the setting of her UTI: -Likely driving the above -On Rocephin per IM  3. Leukocytosis: -See #2  4. Dementia: -Lives alone -Consider getting case manager involved for placement  5. Parkinson's: -Per IM  6. HTN: -Stable  Signed, Eula Listen, PA-C Pager: [phone] 11/30/2015, 4:58 PM

## 2015-11-30 NOTE — ED Notes (Signed)
Called for echo they will do it on evening shift per echo tech.

## 2015-11-30 NOTE — ED Notes (Signed)
Pharmacy called to send medications ordered that are not kept in ER.

## 2015-11-30 NOTE — Progress Notes (Signed)
Patient arrived to 2A with family at bedside. Only complaint is foot pain that she has at home. Patient is alert with some confusion. Tele verified with CCMD x2. Skin verified with Hyman Hopesaylor Beck, RN. No needs at this time. Vitals are stable. Will continue to monitor.

## 2015-11-30 NOTE — ED Notes (Signed)
MD made aware of troponin and is at bedside at this time.

## 2015-11-30 NOTE — Progress Notes (Signed)
Notified Dr. Anne HahnWillis of patient's tele reading junctional and then afib at times. Heart rate going into the 120's at times but mostly staying in the low 100's. EKG ordered.

## 2015-11-30 NOTE — ED Notes (Signed)
Pt requesting to have 0.25mg  Xanax PRN daily as she takes at home. Pt states that she feels anxious and needs some. PT does not have orders, hospitalist paged.

## 2015-11-30 NOTE — Telephone Encounter (Signed)
Patient daughter wanted us to know patient is admitted to [facility] for severe uti and constipation also having some couplets.  She would like Dr. Mariah MillingGollan to see her as a consult at hospital.

## 2015-11-30 NOTE — ED Provider Notes (Signed)
-----------------------------------------   1:48 AM on 11/30/2015 -----------------------------------------  Updated patient and family members of urinalysis and troponin results. I have discussed with the hospitalist to evaluate in the emergency department for admission for hyponatremia, UTI and elevated troponin.  Irean HongJade J Cid [employer], MD 11/30/15 [phone]

## 2015-12-01 LAB — ECHOCARDIOGRAM COMPLETE
HEIGHTINCHES: 62 in
Weight: 2384 oz

## 2015-12-01 LAB — GLUCOSE, CAPILLARY: GLUCOSE-CAPILLARY: 133 mg/dL — AB (ref 65–99)

## 2015-12-01 MED FILL — Medication: Qty: 1 | Status: AC

## 2015-12-03 LAB — URINE CULTURE: SPECIAL REQUESTS: NORMAL

## 2015-12-10 NOTE — Progress Notes (Signed)
Funeral home in to pick up patient.

## 2015-12-10 NOTE — Discharge Summary (Addendum)
[facility] [place] [facility]    Death Note - please see Last Note for all details.  PATIENT NAME: Cheryl Wilson    MR#:  [id number]  DATE OF BIRTH:  11/19/1927  DATE OF ADMISSION:  05-24-2016  PRIMARY CARE PHYSICIAN: SPARKS,JEFFREY D, MD   ADMISSION DIAGNOSIS:  Hyponatremia [E87.1] Weakness [R53.1] Generalized abdominal pain [R10.84] Elevated troponin [R79.89] Non-intractable vomiting with nausea, vomiting of unspecified type [R11.2]  HISTORY OF PRESENT ILLNESS ON ADMISSION:  Cheryl Wilson is a 82 y.o. female who presents with Constipation. Patient states that she had gone several days without having a bowel movement and was having significant nausea. She took mag citrate at home and was able to have a couple of bowel movements. However, her nausea persisted. She came to the ED for evaluation for this. Here she was found to have likely UTI. She also was found to have an elevated troponin at 0.8. Upon further review, family at bedside states that she has had some symptoms recently including gum pain that radiated to her jaw and into her underarms. She also had a bout of chest pain last November, both of these raising some suspicion for true cardiac pathology. She was supposed to be seeing Dr. Mariah MillingGollan on in his clinic today for initial consultation. She was also found an evaluation in the ED to be hyponatremic with a sodium of 127. Due to this and also with her elevated troponin and UTI, which cultures later showed due to E Coli, hospitalists were called for admission and further evaluation.  HOSPITAL COURSE:  Patient was seen by cardiology who felt that her elevated troponin level was due to demand ischemia in setting of possible underlying CAD provoked by infection and constipation; this largely due to the fact that her troponin level did not trend upwards.  They recommended medical management with non emergent stress testing and echocardiogram.  Echo done  and read pending.  Patient had an episode early on in the evening on the day of her code event where her heart rate became elevated and her rhythm changed to afib alternating with junctional rhythm on the telemetry monitor.  STAT EKG for confirmation showed sinus tach with frequent PVCs.  She then became very agitated and shortly lost her pulse.  She underwent CPR and ACLS resuscitation attempt with regained pulse early on in the event, but immediately went pulseless again and was not able to attain ROSC.  Code was called at time listed below.  Family was notified and patient's daughter was present during last part of the code event.      Pronounced dead by Anne HahnWILLIS, Cheryl Wilson on 12/02/2015 at 12:31 AM after 1 full minute of auscultation revealed absent heart and lung sounds, absent corneal and pupillary reflexes, and absent response to painful stimuli.                  Cause of death: Myocardial Infarction   Cheryl Wilson 11/20/2015, 12:41 AM  [facility] [place] [facility]    OFFICE [phone]  Total clinical and documentation time for today: >30 minutes  On post mortem chart review, patient also had AKI on admission, likely due to prerenal injury from her cardiac pathology.

## 2015-12-10 NOTE — Progress Notes (Signed)
Donor services called. Patient not a candidate for donation.

## 2015-12-10 NOTE — Progress Notes (Signed)
Patient had become more agitated despite receiving xanax around 2345. Patient started yelling "I'm going to die". Respirations increased and started to become more labored. Skin color started to change. Two RNs were in the room with the patient as this RN went to call Doctor. While on the phone with the doctor a code was called. Patient had gone  unresponsive. CPR was initiated at 2355 and concluded at 0031. Patient pronounced at 0031. Family called and in the room with the patient.

## 2015-12-10 NOTE — Progress Notes (Signed)
°   11/09/2015 0200  Clinical Encounter Type  Visited With Family  Visit Type Code;Death  Referral From Nurse  Consult/Referral To Chaplain  Spiritual Encounters  Spiritual Needs Grief support  Pastoral care and support of family after patient death. Chaplain [employer]elyn [employer]s Ext [phone]

## 2015-12-10 DEATH — deceased

## 2016-02-03 ENCOUNTER — Ambulatory Visit: Payer: Medicare Other | Admitting: Urology

## 2017-01-06 IMAGING — CT CT HEAD W/O CM
1 of 2 series · 16 of 30 positions shown, 20 images · non-contrast
Comparison: Brain MRI 08/26/2014, head CT 07/08/2012

CLINICAL DATA: Slurred speech.  Difficulty walking.  Headache.

EXAM:
CT HEAD WITHOUT CONTRAST
TECHNIQUE: Contiguous axial images were obtained from the base of the skull
through the vertex without intravenous contrast.

[Series 2: head wo · axial · 0.47mm/px · z∈[-200,-74]mm · 16 of 32 slices shown, 20 images]
[im 2/32  brain]
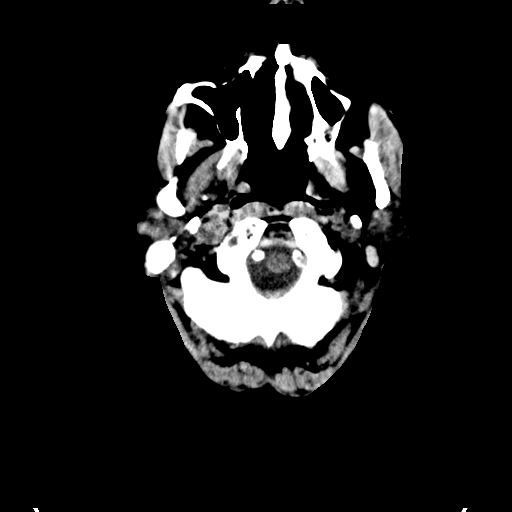
[im 2/32  bone]
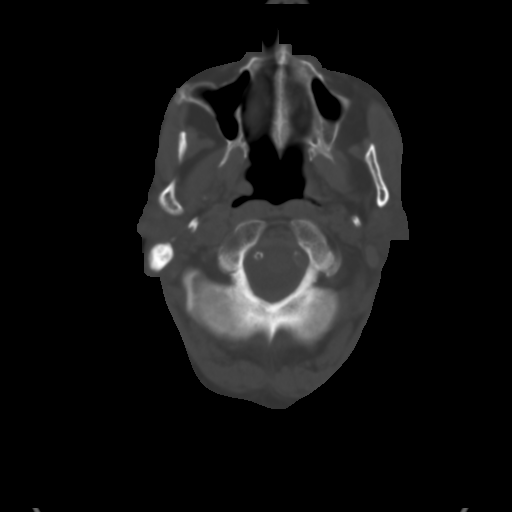
[im 5/32  brain]
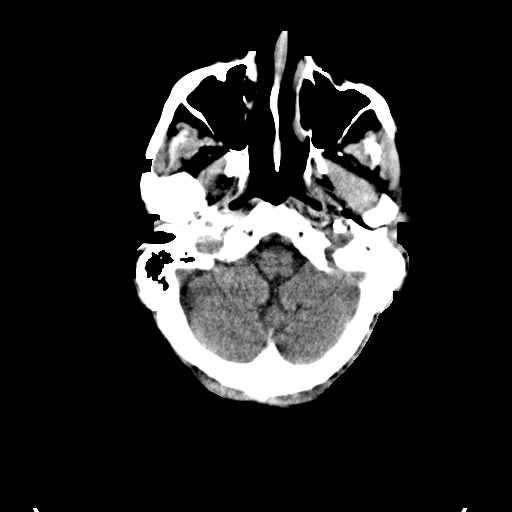
[im 6/32  brain]
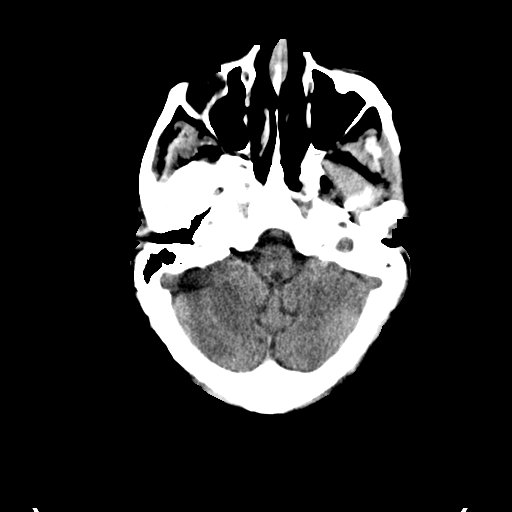
[im 7/32  brain]
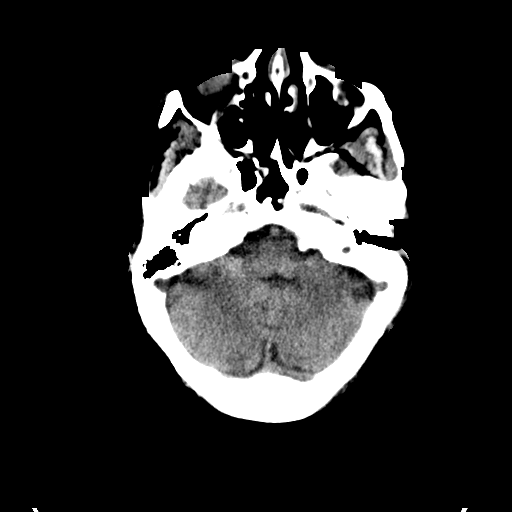
[im 10/32  brain]
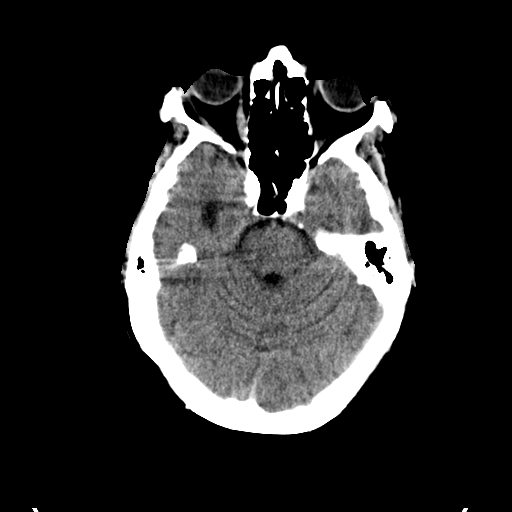
[im 10/32  bone]
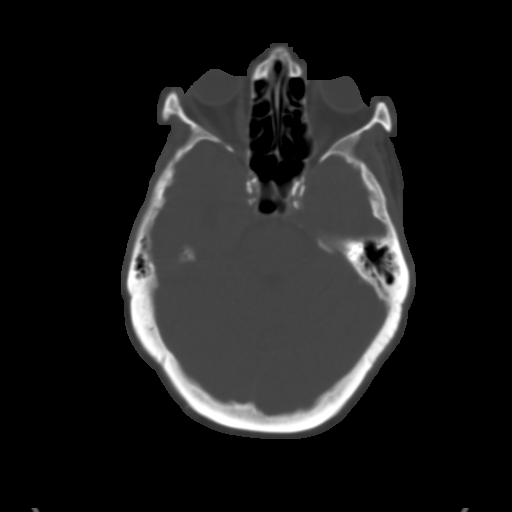
[im 11/32  brain]
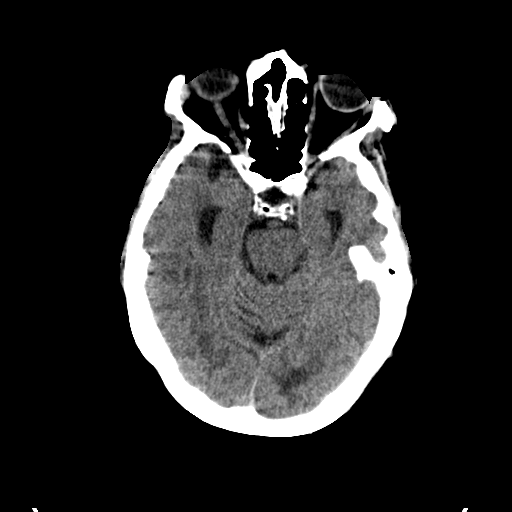
[im 13/32  brain]
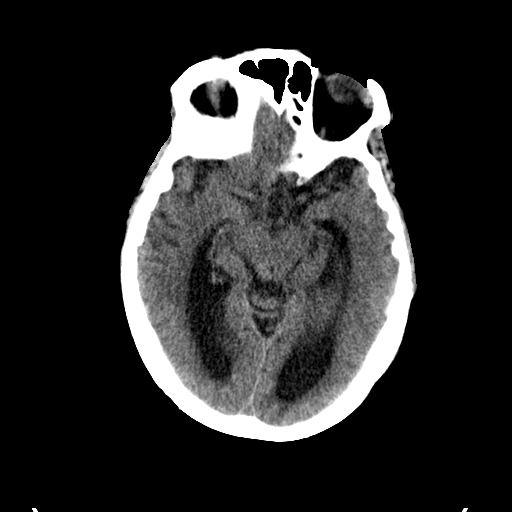
[im 15/32  brain]
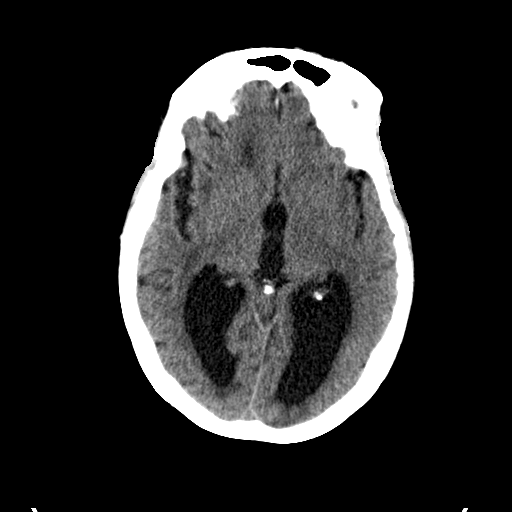
[im 17/32  brain]
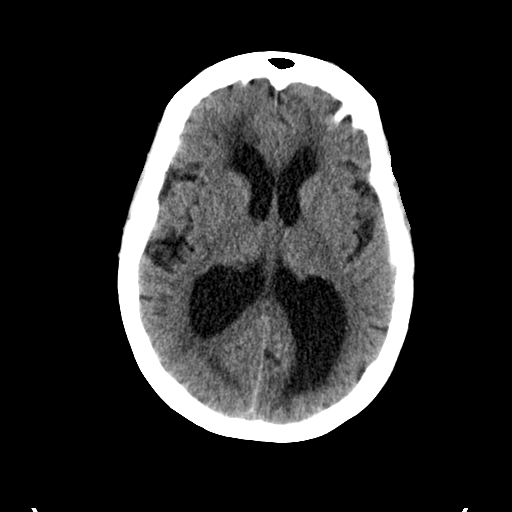
[im 17/32  bone]
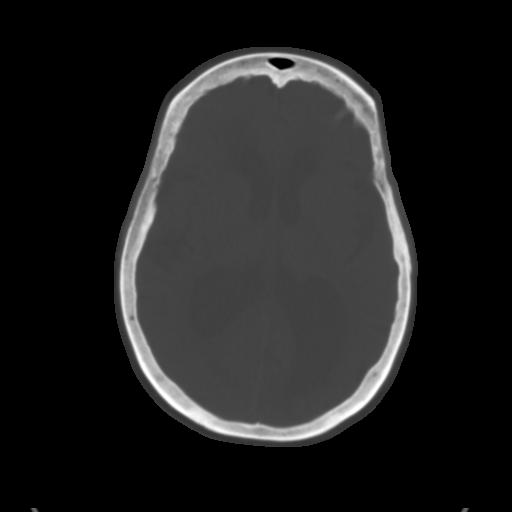
[im 19/32  brain]
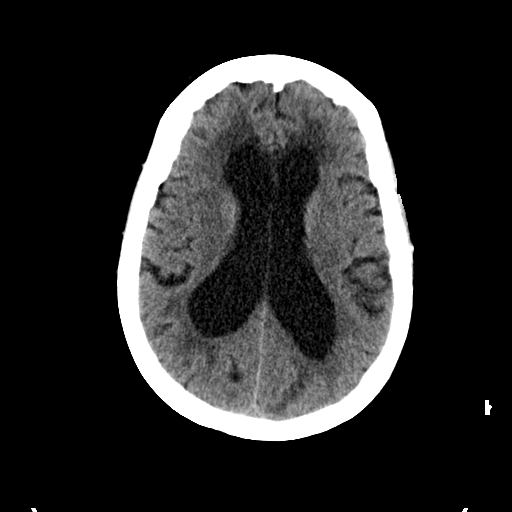
[im 21/32  brain]
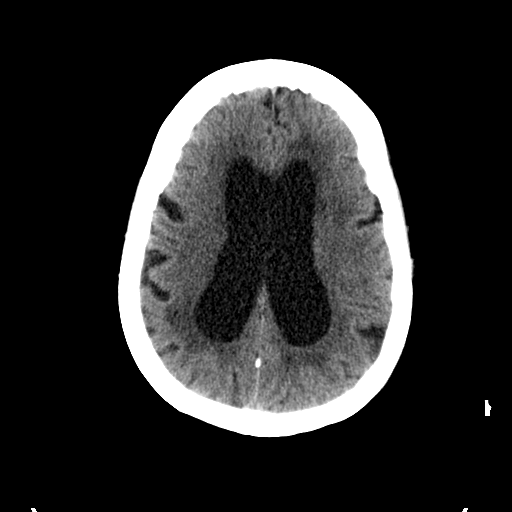
[im 22/32  brain]
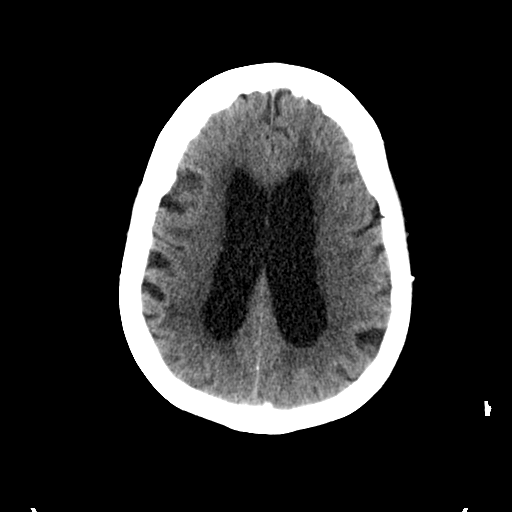
[im 25/32  brain]
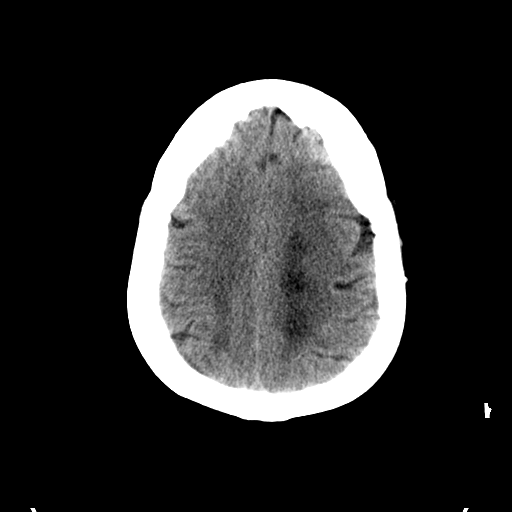
[im 25/32  bone]
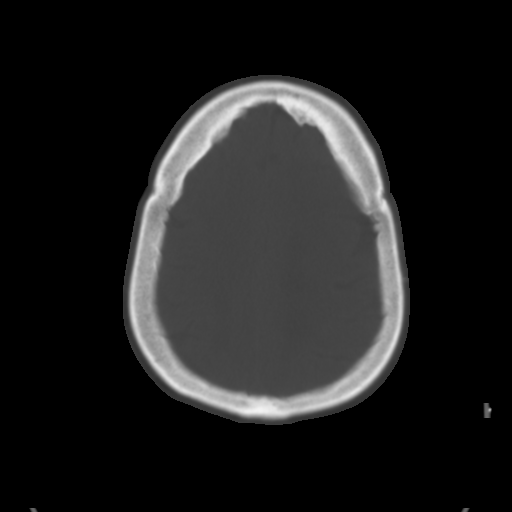
[im 26/32  brain]
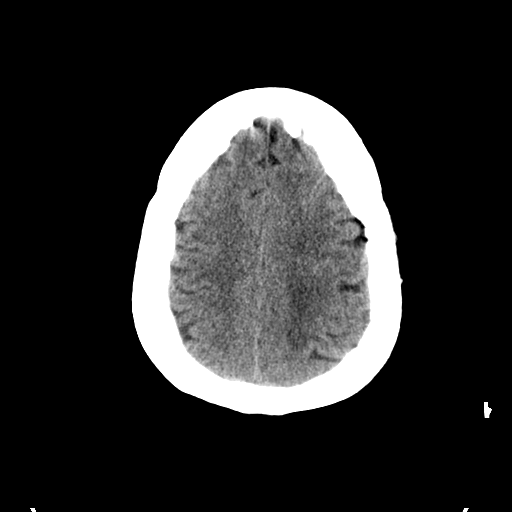
[im 27/32  brain]
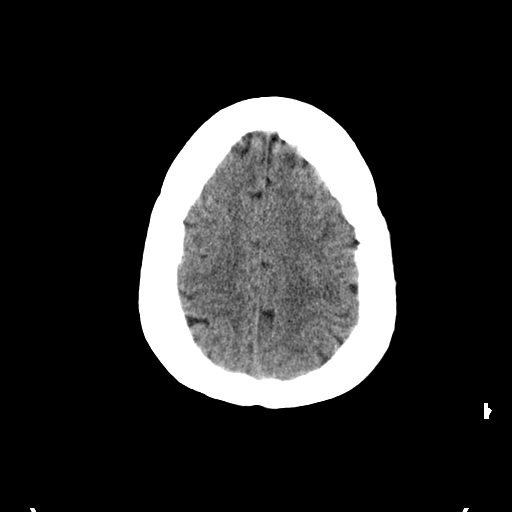
[im 30/32  brain]
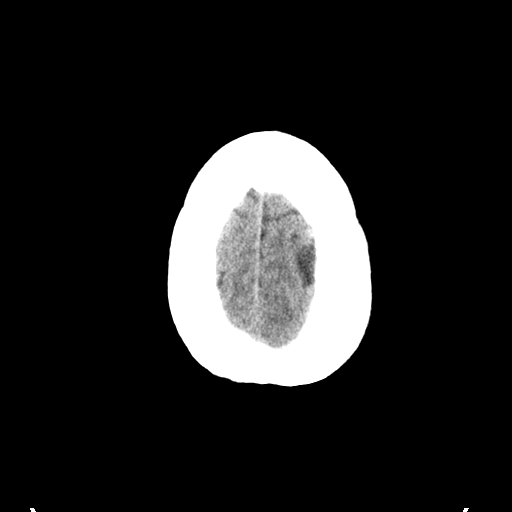

[16 of 30 positions shown; findings below may reference images not displayed]

FINDINGS: Diffuse cerebral atrophy, stable in degree from prior exam. Rather
extensive white matter disease, also stable and likely sequela of
prior chronic small vessel ischemia. No intracranial hemorrhage,
evidence of acute ischemia, or cerebral edema. No subdural or
intracranial fluid collection. Atherosclerosis noted of the
skullbase vasculature. Chronic opacification of lower left mastoid
air cells. Paranasal sinuses are clear. No acute calvarial
abnormality.
IMPRESSION: Advanced atrophy and chronic small vessel ischemic change without CT
findings of acute intracranial abnormality. The appearance is
unchanged from prior exam.
